# Patient Record
Sex: Male | Born: 1985
Health system: Southern US, Community
[De-identification: ages and names within clinical notes are randomized; demographics above are authoritative.]

## PROBLEM LIST (undated history)

## (undated) DIAGNOSIS — F329 Major depressive disorder, single episode, unspecified: Secondary | ICD-10-CM

## (undated) DIAGNOSIS — J189 Pneumonia, unspecified organism: Secondary | ICD-10-CM

## (undated) DIAGNOSIS — F988 Other specified behavioral and emotional disorders with onset usually occurring in childhood and adolescence: Secondary | ICD-10-CM

## (undated) DIAGNOSIS — F411 Generalized anxiety disorder: Secondary | ICD-10-CM

## (undated) DIAGNOSIS — G4700 Insomnia, unspecified: Secondary | ICD-10-CM

## (undated) DIAGNOSIS — F3289 Other specified depressive episodes: Secondary | ICD-10-CM

## (undated) DIAGNOSIS — J309 Allergic rhinitis, unspecified: Secondary | ICD-10-CM

## (undated) HISTORY — DX: Major depressive disorder, single episode, unspecified: F32.9

## (undated) HISTORY — DX: Allergic rhinitis, unspecified: J30.9

## (undated) HISTORY — DX: Other specified behavioral and emotional disorders with onset usually occurring in childhood and adolescence: F98.8

## (undated) HISTORY — DX: Insomnia, unspecified: G47.00

## (undated) HISTORY — DX: Pneumonia, unspecified organism: J18.9

## (undated) HISTORY — DX: Other specified depressive episodes: F32.89

## (undated) HISTORY — DX: Generalized anxiety disorder: F41.1

---
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
## 2008-10-29 HISTORY — PX: APPENDECTOMY: SHX54

## 2009-04-07 ENCOUNTER — Inpatient Hospital Stay (HOSPITAL_COMMUNITY): Admission: EM | Admit: 2009-04-07 | Discharge: 2009-04-08 | Payer: Self-pay | Admitting: Emergency Medicine

## 2009-04-07 ENCOUNTER — Encounter (INDEPENDENT_AMBULATORY_CARE_PROVIDER_SITE_OTHER): Payer: Self-pay | Admitting: Surgery

## 2010-04-24 ENCOUNTER — Encounter: Admission: RE | Admit: 2010-04-24 | Discharge: 2010-04-24 | Payer: Self-pay | Admitting: Family Medicine

## 2010-04-27 ENCOUNTER — Inpatient Hospital Stay (HOSPITAL_COMMUNITY): Admission: EM | Admit: 2010-04-27 | Discharge: 2010-05-02 | Payer: Self-pay | Admitting: Emergency Medicine

## 2010-04-27 ENCOUNTER — Encounter: Payer: Self-pay | Admitting: Internal Medicine

## 2010-04-27 ENCOUNTER — Ambulatory Visit: Payer: Self-pay | Admitting: Internal Medicine

## 2010-04-27 DIAGNOSIS — J189 Pneumonia, unspecified organism: Secondary | ICD-10-CM | POA: Insufficient documentation

## 2010-04-27 LAB — CONVERTED CEMR LAB
Eosinophils Relative: 0.1 %
Hemoglobin: 13.6 g/dL
Lymphocytes, automated: 14 %
Monocytes Relative: 6 %
Neutrophils Relative %: 79 %
Platelets: 185 10*3/uL
WBC: 8.5 10*3/uL

## 2010-04-29 ENCOUNTER — Encounter: Payer: Self-pay | Admitting: Internal Medicine

## 2010-04-29 LAB — CONVERTED CEMR LAB
CO2: 25 meq/L
MCV: 90.5 fL
Platelets: 212 10*3/uL
Potassium: 3.8 meq/L
Sodium: 136 meq/L

## 2010-05-01 ENCOUNTER — Encounter: Payer: Self-pay | Admitting: Internal Medicine

## 2010-05-01 LAB — CONVERTED CEMR LAB
Eosinophils Relative: 4 %
HCT: 37.2 %
Hemoglobin: 13.1 g/dL
MCV: 90.7 fL
Monocytes Relative: 7 %
Platelets: 348 10*3/uL
RDW: 12.2 %

## 2010-05-23 ENCOUNTER — Encounter: Payer: Self-pay | Admitting: Internal Medicine

## 2010-05-23 DIAGNOSIS — F411 Generalized anxiety disorder: Secondary | ICD-10-CM | POA: Insufficient documentation

## 2010-05-23 DIAGNOSIS — G4700 Insomnia, unspecified: Secondary | ICD-10-CM | POA: Insufficient documentation

## 2010-05-26 ENCOUNTER — Ambulatory Visit: Payer: Self-pay | Admitting: Internal Medicine

## 2010-05-26 DIAGNOSIS — J309 Allergic rhinitis, unspecified: Secondary | ICD-10-CM | POA: Insufficient documentation

## 2010-05-26 DIAGNOSIS — F329 Major depressive disorder, single episode, unspecified: Secondary | ICD-10-CM | POA: Insufficient documentation

## 2010-05-26 DIAGNOSIS — F3289 Other specified depressive episodes: Secondary | ICD-10-CM | POA: Insufficient documentation

## 2010-09-06 ENCOUNTER — Ambulatory Visit: Payer: Self-pay | Admitting: Internal Medicine

## 2010-09-06 ENCOUNTER — Encounter: Payer: Self-pay | Admitting: Internal Medicine

## 2010-09-06 DIAGNOSIS — J0110 Acute frontal sinusitis, unspecified: Secondary | ICD-10-CM | POA: Insufficient documentation

## 2010-09-28 ENCOUNTER — Ambulatory Visit: Payer: Self-pay | Admitting: Internal Medicine

## 2010-09-28 LAB — CONVERTED CEMR LAB
AST: 23 units/L (ref 0–37)
Alkaline Phosphatase: 72 units/L (ref 39–117)
Calcium: 9.2 mg/dL (ref 8.4–10.5)
Creatinine, Ser: 0.8 mg/dL (ref 0.4–1.5)
GFR calc non Af Amer: 135.82 mL/min (ref >60)
Glucose, Bld: 87 mg/dL (ref 70–99)
HCT: 42.7 % (ref 39.0–52.0)
HDL: 37.7 mg/dL — ABNORMAL LOW (ref >39.00)
Hemoglobin: 14.9 g/dL (ref 13.0–17.0)
LDL Cholesterol: 142 mg/dL — ABNORMAL HIGH (ref 0–99)
Lymphocytes Relative: 32.1 % (ref 12.0–46.0)
Lymphs Abs: 1.7 10*3/uL (ref 0.7–4.0)
Monocytes Absolute: 0.4 10*3/uL (ref 0.1–1.0)
Monocytes Relative: 8 % (ref 3.0–12.0)
RBC: 4.74 M/uL (ref 4.22–5.81)
Triglycerides: 93 mg/dL (ref 0.0–149.0)
WBC: 5.2 10*3/uL (ref 4.5–10.5)

## 2010-11-28 NOTE — Letter (Signed)
Summary: [facility]  [address] [place], [place] [address]   Phone: [phone]  Fax: [phone]    Today's Date: September 06, 2010  Name of Patient: Matthew Glover  The above named patient had a medical visit today   Please take this into consideration when reviewing the time away from work.   Special Instructions:  [  ] None  [ xx ] To be off the remainder of today, returning to the normal work schedule tomorrow.  [  ] To be off until the next scheduled appointment on ______________________.  [  ] Other ________________________________________________________________ ________________________________________________________________________   Sincerely yours,   Rene Paci MD

## 2010-11-28 NOTE — Assessment & Plan Note (Signed)
Summary: 3-6 MSO F/U #/CD   Vital Signs:  Patient profile:   25 year old male Height:      71 inches (180.34 cm) Weight:      214.0 pounds (97.27 kg) O2 Sat:      96 % on Room air Temp:     97.0 degrees F (36.11 degrees C) oral Pulse rate:   59 / minute BP sitting:   110 / 70  (left arm) Cuff size:   large  Vitals Entered By: Orlan Leavens RMA (September 28, 2010 9:50 AM)  O2 Flow:  Room air CC: 3-6 month follow-up Is Patient Diabetic? No Pain Assessment Patient in pain? no        Primary Care Provider:  Newt Lukes MD  CC:  3-6 month follow-up.  History of Present Illness: patient is here today for annual physical. Patient feels well and has no complaints.   reviewed chronic issues- 1) hosp for RLL PNA 04/2010 at [facility]  required BiPAP overnight for assoc hypoxic failure completed all abx - breathing returned to normal - no cough, sputum or fever - resolved pleuritic pain lower posterior right side   2) grief reaction hx - precipitated by sudden and unexpected death of father Sep 29, 2009 - no longer using ambein to help with sleep - denies sadness, no tearfulness or anxiety - denies hopeless or difficulty with relationships/work has not been to counseling at any time  3) allergic rhinitis - use otc meds as needed for same and nasal steroids-  no current or recent flares -  no sinus pressure - never on rx med for same resolution of prior sinus symptoms 1 mo ago (see prior OV)  Preventive Screening-Counseling & Management  Alcohol-Tobacco     Alcohol drinks/day: <1     Alcohol Counseling: not indicated; use of alcohol is not excessive or problematic     Smoking Status: never     Tobacco Counseling: not indicated; no tobacco use  Caffeine-Diet-Exercise     Caffeine use/day: 5-6     Caffeine Counseling: decrease use of caffeine     Diet Counseling: not indicated; diet is assessed to be healthy     Does Patient Exercise: no     Exercise Counseling: to improve  exercise regimen     Depression Counseling: further diagnostic testing and/or other treatment is indicated  Safety-Violence-Falls     Seat Belt Counseling: not indicated; patient wears seat belts     Helmet Counseling: not indicated; patient wears helmet when riding bicycle/motocycle     Firearms in the Home: firearms in the home     Firearm Counseling: not indicated; uses recommended firearm safety measures     Smoke Detectors: yes     Violence in the Home: no risk noted     Fall Risk Counseling: not indicated; no significant falls noted  Clinical Review Panels:  CBC   WBC:  6.4 (05/01/2010)   RBC:  4.11 (05/01/2010)   Hgb:  13.1 (05/01/2010)   Hct:  37.2 (05/01/2010)   Platelets:  348 (05/01/2010)   MCV  90.7 (05/01/2010)   RDW  12.2 (05/01/2010)   PMN:  64 (05/01/2010)   Monos:  7 (05/01/2010)   Eosinophils:  4 (05/01/2010)   Basophil:  0 (05/01/2010)  Complete Metabolic Panel   Glucose:  116 (04/29/2010)   Sodium:  136 (04/29/2010)   Potassium:  3.8 (04/29/2010)   Chloride:  104 (04/29/2010)   CO2:  25 (04/29/2010)   Current Medications (  verified): 1)  Ambien 5 Mg Tabs (Zolpidem Tartrate) .... Take 1 At Bedtime As Needed 2)  Nasonex 50 Mcg/act Susp (Mometasone Furoate) .... 2 Sprays Each Nostril  Every Morning  Allergies (verified): 1)  ! Tylenol 2)  ! Ibuprofen  Past History:  Past medical, surgical, family and social histories (including risk factors) reviewed, and no changes noted (except as noted below).  Past Medical History: Anxiety/Depression   grief rxn after sudden death of father 09/26/09 Allergic rhinitis bilateral pneumonia, req bipap 04/2010 hosp  Past Surgical History: Reviewed history from 05/26/2010 and no changes required. Appendectomy (2010)  Family History: Reviewed history from 05/26/2010 and no changes required. Family History Diabetes 1st degree relative (paent, grandparent) Family History High cholesterol (parent,  grandparent) Heart disease (parent, grandparent) Family History Hypertension (parent, grandparent)  Social History: Reviewed history from 05/26/2010 and no changes required. 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         also see HPI above. I have reviewed all other systems and they were negative.   Physical Exam  General:  alert, well-developed, well-nourished, and cooperative to examination.    Head:  Normocephalic and atraumatic without obvious abnormalities. No apparent alopecia or balding. Eyes:  vision grossly intact; pupils equal, round and reactive to light.  conjunctiva and lids normal.    Ears:  normal pinnae bilaterally, without erythema, swelling, or tenderness to palpation. TMs clear, without effusion, or cerumen impaction. Hearing grossly normal bilaterally  Mouth:  teeth and gums in good repair; mucous membranes moist, without lesions or ulcers. oropharynx clear without exudate, no erythema.  Neck:  supple, full ROM, no masses, no thyromegaly; no thyroid nodules or tenderness. no JVD or carotid bruits.   Lungs:  normal respiratory effort, no intercostal retractions or use of accessory muscles; normal breath sounds bilaterally - no crackles and no wheezes.    Heart:  normal rate, regular rhythm, no murmur, and no rub. BLE without edema. Abdomen:  soft, non-tender, normal bowel sounds, no distention; no masses and no appreciable hepatomegaly or splenomegaly.   Genitalia:  defer at pt request Msk:  No deformity or scoliosis noted of thoracic or lumbar spine.   Neurologic:  alert & oriented X3 and cranial nerves II-XII symetrically intact.  strength normal in all extremities, sensation intact to light touch, and gait normal. speech fluent without dysarthria or aphasia; follows  commands with good comprehension.  Skin:  no rashes, vesicles, ulcers, or erythema. No nodules or irregularity to palpation.  Psych:  Oriented X3, memory intact for recent and remote, normally interactive, good eye contact, not anxious appearing, not depressed appearing, and not agitated.      Impression & Recommendations:  Problem # 1:  PREVENTIVE HEALTH CARE (ICD-V70.0) Patient has been counseled on age-appropriate routine health concerns for screening and prevention. These are reviewed and up-to-date. Immunizations are up-to-date or declined. Labs ordered and will be reviewed.  Orders: TLB-Lipid Panel (80061-LIPID) TLB-BMP (Basic Metabolic Panel-BMET) (80048-METABOL) TLB-CBC Platelet - w/Differential (85025-CBCD) TLB-Hepatic/Liver Function Pnl (80076-HEPATIC) TLB-TSH (Thyroid Stimulating Hormone) (84443-TSH)  Problem # 2:  ALLERGIC RHINITIS (ICD-477.9)  His updated medication list for this problem includes:    Nasonex 50 Mcg/act Susp (Mometasone furoate) .Marland Kitchen... 2 sprays each nostril  every morning  Discussed use of allergy medications and environmental measures.  Problem # 3:  INSOMNIA (ICD-780.52) resolved The following medications were removed from the medication list:    Ambien 5 Mg Tabs (Zolpidem tartrate) .Marland Kitchen... Take 1 at bedtime as needed  Complete Medication List: 1)  Nasonex 50 Mcg/act Susp (Mometasone furoate) .... 2 sprays each nostril  every morning  Patient Instructions: 1)  it was good to see you today. 2)  exam and vitals look good 3)  no medication changes  4)  test(s) ordered today - your results will be posted on the phone tree for review in 48-72 hours from the time of test completion; call [phone] and enter your 9 digit MRN (listed above on this page, just below your name); if any changes need to be made or there are abnormal results, you will be contacted directly.  5)  Please schedule a follow-up appointment as needed.   Orders Added: 1)  TLB-Lipid  Panel [80061-LIPID] 2)  TLB-BMP (Basic Metabolic Panel-BMET) [80048-METABOL] 3)  TLB-CBC Platelet - w/Differential [85025-CBCD] 4)  TLB-Hepatic/Liver Function Pnl [80076-HEPATIC] 5)  TLB-TSH (Thyroid Stimulating Hormone) [84443-TSH] 6)  Est. Patient 18-39 years [91478]

## 2010-11-28 NOTE — Assessment & Plan Note (Signed)
Summary: Matthew Glover-...   Vital Signs:  Patient profile:   25 year old male Height:      71 inches (180.34 cm) Weight:      215.4 pounds (97.91 kg) BMI:     30.15 O2 Sat:      98 % on Room air Temp:     98.0 degrees F (36.67 degrees C) oral Pulse rate:   62 / minute BP sitting:   120 / 72  (left arm) Cuff size:   large  Vitals Entered By: Orlan Leavens RMA (May 26, 2010 10:39 AM)  O2 Flow:  Room air CC: New patient Is Patient Diabetic? No Pain Assessment Patient in pain? no        Primary Care Provider:  Newt Lukes MD  CC:  New patient.  History of Present Illness: new pt to me and our practice - here to est care -  1) recent hosp for RLL PNA 6/30-7/5, 2011 at [facility]  required BiPAP overnight for assoc hypoxic failure completed all abx - breathing returned to normal - no cough, sputum or fever - occ pleuritic pain lower posterior right side with deep insp effort  2) grief reaction - precipitated by sudden and unexpected death of father 2009-09-22 - using ambein to help with sleep issues since that time - feels sadness but less than initially - no tearfulness or anxiety - denies hopeless or difficulty with relationships/work has not been to counseling at any time  3) allergic rhinitis - use otc meds as needed for same - no current flares -  no sinus pressure - never on rx med for same  Preventive Screening-Counseling & Management  Alcohol-Tobacco     Alcohol drinks/day: <1     Alcohol Counseling: not indicated; use of alcohol is not excessive or problematic     Smoking Status: never     Tobacco Counseling: not indicated; no tobacco use  Caffeine-Diet-Exercise     Caffeine use/day: 5-6     Caffeine Counseling: decrease use of caffeine     Diet Counseling: not indicated; diet is assessed to be healthy     Does Patient Exercise: no     Exercise Counseling: to improve exercise regimen     Depression  Counseling: further diagnostic testing and/or other treatment is indicated  Safety-Violence-Falls     Seat Belt Counseling: not indicated; patient wears seat belts     Helmet Counseling: not indicated; patient wears helmet when riding bicycle/motocycle     Firearms in the Home: firearms in the home     Firearm Counseling: not indicated; uses recommended firearm safety measures     Smoke Detectors: yes     Violence in the Home: no risk noted     Fall Risk Counseling: not indicated; no significant falls noted  Clinical Review Panels:  CBC   WBC:  6.4 (05/01/2010)   RBC:  4.11 (05/01/2010)   Hgb:  13.1 (05/01/2010)   Hct:  37.2 (05/01/2010)   Platelets:  348 (05/01/2010)   MCV  90.7 (05/01/2010)   RDW  12.2 (05/01/2010)   PMN:  64 (05/01/2010)   Monos:  7 (05/01/2010)   Eosinophils:  4 (05/01/2010)   Basophil:  0 (05/01/2010)  Complete Metabolic Panel   Glucose:  116 (04/29/2010)   Sodium:  136 (04/29/2010)   Potassium:  3.8 (04/29/2010)   Chloride:  104 (04/29/2010)   CO2:  25 (04/29/2010)   Current Medications (verified): 1)  Ambien  5 Mg Tabs (Zolpidem Tartrate) .... Take 1 At Bedtime As Needed  Allergies (verified): 1)  ! Tylenol 2)  ! 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 I have reviewed all other systems and they were  negative.   Physical Exam  General:  alert, well-developed, well-nourished, and cooperative to examination.    Eyes:  vision grossly intact; pupils equal, round and reactive to light.  conjunctiva and lids normal.    Neck:  thick, supple, full ROM, no masses, no thyromegaly; no thyroid nodules or tenderness. no JVD or carotid bruits.   Lungs:  normal respiratory effort, no intercostal retractions or use of accessory muscles; normal breath sounds bilaterally - no crackles and no wheezes.    Heart:  normal rate, regular rhythm, no murmur, and no rub. BLE without edema. normal DP pulses and normal cap refill in all 4 extremities    Skin:  no rashes, vesicles, ulcers, or erythema. No nodules or irregularity to palpation.  Psych:  Oriented X3, memory intact for recent and remote, normally interactive, good eye contact, not anxious appearing, not depressed appearing, and not agitated.      Impression & Recommendations:  Problem # 1:  PNEUMONIA (ICD-486)  hosp 04/2010 at [facility] for same with hypoxic resp failure, required BiPAP - s/p abx - fully recovered - no pulm symptoms or hypoxia - hosp course, labs and tests reviewed -  Problem # 2:  ANXIETY (ICD-300.00) a/w greief reaction from unexpected loss of father 17-Sep-2009 - MI seems to be recovering approp - counseling offered and support ok to cont ambein as needed  The following medications were removed from the medication list:    Xanax 0.25 Mg Tabs (Alprazolam) .Marland Kitchen... Take 1 q 8 hours as needed  Problem # 3:  ALLERGIC RHINITIS (ICD-477.9)  Discussed use of allergy medications and environmental measures.   Complete Medication List: 1)  Ambien 5 Mg Tabs (Zolpidem tartrate) .... Take 1 at bedtime as needed  Patient Instructions: 1)  it was good to see you today. 2)  hospital course reviewed - 3)  Please schedule a follow-up appointment in 3-6 months to check cholesterol and review mood, call sooner if problems.

## 2010-11-28 NOTE — Assessment & Plan Note (Signed)
Summary: sore throat/headache/cd   Vital Signs:  Patient profile:   25 year old male Height:      71 inches (180.34 cm) Weight:      215 pounds (97.73 kg) O2 Sat:      98 % on Room air Temp:     97.5 degrees F (36.39 degrees C) oral Pulse rate:   61 / minute BP sitting:   112 / 68  (left arm) Cuff size:   large  Vitals Entered By: Orlan Leavens RMA (September 06, 2010 2:10 PM)  O2 Flow:  Room air CC: Headache & sore throat x's1 week, URI symptoms Is Patient Diabetic? No Pain Assessment Patient in pain? no        Primary Care Provider:  Newt Lukes MD  CC:  Headache & sore throat x's1 week and URI symptoms.  History of Present Illness: prior OV 04/2010 reviewed  1) recent hosp for RLL PNA 6/30-7/5, 2011 at [facility]  required BiPAP overnight for assoc hypoxic failure completed all abx - breathing returned to normal - no cough, sputum or fever - occ pleuritic pain lower posterior right side with deep insp effort  2) grief reaction - precipitated by sudden and unexpected death of father September 27, 2009 - using ambein to help with sleep issues since that time - feels sadness but less than initially - no tearfulness or anxiety - denies hopeless or difficulty with relationships/work has not been to counseling at any time  3) allergic rhinitis - use otc meds as needed for same - no current flares -  no sinus pressure - never on rx med for same  URI Symptoms      This is a 25 year old man who presents with URI symptoms.  The symptoms began 6 days ago.  The severity is described as moderate.  headache above both eyes, forehead. similar now to symptoms at start of pna infx summer 2011.  The patient reports nasal congestion, purulent nasal discharge, sore throat, and earache, but denies dry cough, productive cough, and sick contacts.  Associated symptoms include fever.  The patient denies dyspnea, wheezing, vomiting, and diarrhea.  The patient also reports headache, muscle aches, and  severe fatigue.  The patient denies sneezing, seasonal symptoms, and response to antihistamine.  Risk factors for Strep sinusitis include absence of cough.  The patient denies the following risk factors for Strep sinusitis: tooth pain and tender adenopathy.    Clinical Review Panels:  CBC   WBC:  6.4 (05/01/2010)   RBC:  4.11 (05/01/2010)   Hgb:  13.1 (05/01/2010)   Hct:  37.2 (05/01/2010)   Platelets:  348 (05/01/2010)   MCV  90.7 (05/01/2010)   RDW  12.2 (05/01/2010)   PMN:  64 (05/01/2010)   Monos:  7 (05/01/2010)   Eosinophils:  4 (05/01/2010)   Basophil:  0 (05/01/2010)  Complete Metabolic Panel   Glucose:  116 (04/29/2010)   Sodium:  136 (04/29/2010)   Potassium:  3.8 (04/29/2010)   Chloride:  104 (04/29/2010)   CO2:  25 (04/29/2010)   Current Medications (verified): 1)  Ambien 5 Mg Tabs (Zolpidem Tartrate) .... Take 1 At Bedtime As Needed  Allergies (verified): 1)  ! Tylenol 2)  ! Ibuprofen  Past History:  Past Medical History: Anxiety/Depression, after sudden death of father 09-27-2009 Allergic rhinitis bilateral pneumonia, req bipap 04/2010 [facility]  Review of Systems  The patient denies vision loss, decreased hearing, hoarseness, chest pain, dyspnea on exertion, and severe indigestion/heartburn.  Physical Exam  General:  alert, well-developed, well-nourished, and cooperative to examination.   mom at side Head:  mod pain to palp frontal sinuses Eyes:  vision grossly intact; pupils equal, round and reactive to light.  conjunctiva and lids normal.    Ears:  l tm mild red, r tm clear Mouth:  teeth and gums in good repair; mucous membranes moist, without lesions or ulcers. oropharynx clear without exudate, mild erythema.  pnd Lungs:  normal respiratory effort, no intercostal retractions or use of accessory muscles; normal breath sounds bilaterally - no crackles and no wheezes.    Heart:  normal rate, regular rhythm, no murmur, and no rub. BLE without  edema.   Impression & Recommendations:  Problem # 1:  ACUTE FRONTAL SINUSITIS (ICD-461.1)  His updated medication list for this problem includes:    Augmentin 875-125 Mg Tabs (Amoxicillin-pot clavulanate) .Marland Kitchen... 1 by mouth two times a day x 7 days    Nasonex 50 Mcg/act Susp (Mometasone furoate) .Marland Kitchen... 2 sprays each nostril  every morning  Orders: Prescription Created Electronically [phone])  Instructed on treatment. Call if symptoms persist or worsen.   Problem # 2:  PNEUMONIA (ICD-486)  His updated medication list for this problem includes:    Augmentin 875-125 Mg Tabs (Amoxicillin-pot clavulanate) .Marland Kitchen... 1 by mouth two times a day x 7 days  Orders: T-2 View CXR (71020TC)  hosp 04/2010 at [facility] for same with hypoxic resp failure, required BiPAP - s/p abx - fully recovered - no pulm symptoms or hypoxia - hosp course, labs and tests reviewed - reck xray f/u noe to ensure clearing  nstructed patient to complete antibiotics, and call for worsened shortness of breath or new symptoms.   Complete Medication List: 1)  Ambien 5 Mg Tabs (Zolpidem tartrate) .... Take 1 at bedtime as needed 2)  Augmentin 875-125 Mg Tabs (Amoxicillin-pot clavulanate) .Marland Kitchen.. 1 by mouth two times a day x 7 days 3)  Nasonex 50 Mcg/act Susp (Mometasone furoate) .... 2 sprays each nostril  every morning  Patient Instructions: 1)  it was good to see you today. 2)  Augmentin and Nasonex for sinus symptoms as discussed - your prescriptions have been electronically submitted to your pharmacy. Please take as directed. Contact our office if you believe you're having problems with the medication(s).  3)  chest xray ordered today - your results will be called to you after review 4)  Get plenty of rest, drink lots of clear liquids, and use Tylenol or Ibuprofen for fever and comfort. Return in 7-10 days if you're not better:sooner if you're feeling worse. 5)  work note provided for today Prescriptions: NASONEX 50 MCG/ACT SUSP  (MOMETASONE FUROATE) 2 sprays each nostril  every morning  #1 x 3   Entered and Authorized by:   Newt Lukes MD   Signed by:   Newt Lukes MD on 09/06/2010   Method used:   Electronically to        [facility] [address] [id number]* (retail)       [address] [place], [place]  [address]       Ph: [phone]       Fax: [phone]   RxID:   [id number] AUGMENTIN 875-125 MG TABS (AMOXICILLIN-POT CLAVULANATE) 1 by mouth two times a day x 7 days  #14 x 0   Entered and Authorized by:   Newt Lukes MD   Signed by:   Newt Lukes MD  on 09/06/2010   Method used:   Electronically to        [id number]* (retail)       [address] [place], [place]  [address]       Ph: [phone]       Fax: [phone]   RxID:   [id number]    Orders Added: 1)  Est. Patient Level IV [44010] 2)  Prescription Created Electronically [[id number]] 3)  T-2 View CXR [71020TC]

## 2011-01-14 LAB — COMPREHENSIVE METABOLIC PANEL
ALT: 20 U/L (ref 0–53)
ALT: 26 U/L (ref 0–53)
AST: 25 U/L (ref 0–37)
Albumin: 2.6 g/dL — ABNORMAL LOW (ref 3.5–5.2)
Alkaline Phosphatase: 41 U/L (ref 39–117)
Alkaline Phosphatase: 43 U/L (ref 39–117)
CO2: 27 mEq/L (ref 19–32)
Calcium: 8.1 mg/dL — ABNORMAL LOW (ref 8.4–10.5)
Calcium: 8.1 mg/dL — ABNORMAL LOW (ref 8.4–10.5)
Chloride: 104 mEq/L (ref 96–112)
Chloride: 105 mEq/L (ref 96–112)
Creatinine, Ser: 0.69 mg/dL (ref 0.4–1.5)
GFR calc Af Amer: >60 mL/min (ref >60)
GFR calc non Af Amer: >60 mL/min (ref >60)
Glucose, Bld: 116 mg/dL — ABNORMAL HIGH (ref 70–99)
Potassium: 4.2 mEq/L (ref 3.5–5.1)
Sodium: 136 mEq/L (ref 135–145)
Sodium: 137 mEq/L (ref 135–145)
Total Bilirubin: 0.9 mg/dL (ref 0.3–1.2)
Total Protein: 5.8 g/dL — ABNORMAL LOW (ref 6.0–8.3)

## 2011-01-14 LAB — BLOOD GAS, ARTERIAL
Acid-Base Excess: 1 mmol/L (ref 0.0–2.0)
Acid-Base Excess: 0.5 mmol/L (ref 0.0–2.0)
Bicarbonate: 24.7 mEq/L — ABNORMAL HIGH (ref 20.0–24.0)
Delivery systems: POSITIVE
Drawn by: 213381
Drawn by: 213381
Drawn by: 328211
FIO2: 1 %
FIO2: 1 %
Inspiratory PAP: 11
O2 Content: 10 L/min
O2 Saturation: 98.5 %
Patient temperature: 98.6
Pressure support: 3 cmH2O
RATE: 10 resp/min
TCO2: 21.6 mmol/L (ref 0–100)
TCO2: 22 mmol/L (ref 0–100)
TCO2: 21 mmol/L (ref 0–100)
pCO2 arterial: 36.4 mmHg (ref 35.0–45.0)
pCO2 arterial: 33.1 mmHg — ABNORMAL LOW (ref 35.0–45.0)
pH, Arterial: 7.441 (ref 7.350–7.450)
pO2, Arterial: 130 mmHg — ABNORMAL HIGH (ref 80.0–100.0)

## 2011-01-14 LAB — CBC
MCH: 31 pg (ref 26.0–34.0)
MCH: 31.2 pg (ref 26.0–34.0)
MCHC: 34.5 g/dL (ref 30.0–36.0)
MCHC: 35.1 g/dL (ref 30.0–36.0)
MCV: 90.5 fL (ref 78.0–100.0)
MCV: 88.7 fL (ref 78.0–100.0)
Platelets: 348 10*3/uL (ref 150–400)
Platelets: 185 10*3/uL (ref 150–400)
RBC: 3.7 MIL/uL — ABNORMAL LOW (ref 4.22–5.81)
RBC: 3.9 MIL/uL — ABNORMAL LOW (ref 4.22–5.81)
RBC: 4.11 MIL/uL — ABNORMAL LOW (ref 4.22–5.81)
RDW: 11.8 % (ref 11.5–15.5)
WBC: 6.4 10*3/uL (ref 4.0–10.5)
WBC: 7.6 10*3/uL (ref 4.0–10.5)

## 2011-01-14 LAB — MRSA PCR SCREENING: MRSA by PCR: POSITIVE — AB

## 2011-01-14 LAB — BASIC METABOLIC PANEL
BUN: 6 mg/dL (ref 6–23)
Calcium: 8.4 mg/dL (ref 8.4–10.5)
Creatinine, Ser: 0.88 mg/dL (ref 0.4–1.5)
GFR calc non Af Amer: >60 mL/min (ref >60)
Potassium: 4 mEq/L (ref 3.5–5.1)
Sodium: 134 mEq/L — ABNORMAL LOW (ref 135–145)

## 2011-01-14 LAB — DIFFERENTIAL
Basophils Absolute: 0 10*3/uL (ref 0.0–0.1)
Basophils Relative: 0 % (ref 0–1)
Eosinophils Absolute: 0.2 10*3/uL (ref 0.0–0.7)
Eosinophils Absolute: 0.1 10*3/uL (ref 0.0–0.7)
Eosinophils Relative: 3 % (ref 0–5)
Eosinophils Relative: 1 % (ref 0–5)
Lymphocytes Relative: 16 % (ref 12–46)
Lymphocytes Relative: 25 % (ref 12–46)
Lymphs Abs: 0.9 10*3/uL (ref 0.7–4.0)
Monocytes Absolute: 0.4 10*3/uL (ref 0.1–1.0)
Monocytes Relative: 7 % (ref 3–12)
Monocytes Relative: 6 % (ref 3–12)
Neutrophils Relative %: 64 % (ref 43–77)
Neutrophils Relative %: 79 % — ABNORMAL HIGH (ref 43–77)

## 2011-01-14 LAB — LEGIONELLA ANTIGEN, URINE: Legionella Antigen, Urine: NEGATIVE

## 2011-01-14 LAB — CULTURE, BLOOD (ROUTINE X 2)

## 2011-01-14 LAB — CULTURE, RESPIRATORY W GRAM STAIN: Culture: NORMAL

## 2011-01-14 LAB — STREP PNEUMONIAE URINARY ANTIGEN: Strep Pneumo Urinary Antigen: NEGATIVE

## 2011-01-14 LAB — PROCALCITONIN: Procalcitonin: <0.5 ng/mL

## 2011-01-14 LAB — HIV 1/2 CONFIRMATION: HIV-2 Ab: NEGATIVE

## 2011-01-21 ENCOUNTER — Emergency Department (HOSPITAL_COMMUNITY)
Admission: EM | Admit: 2011-01-21 | Discharge: 2011-01-21 | Disposition: A | Discharge status: Home / Self Care | Payer: 59 | Attending: Emergency Medicine | Admitting: Emergency Medicine

## 2011-01-21 DIAGNOSIS — T63481A Toxic effect of venom of other arthropod, accidental (unintentional), initial encounter: Secondary | ICD-10-CM | POA: Insufficient documentation

## 2011-01-21 DIAGNOSIS — L989 Disorder of the skin and subcutaneous tissue, unspecified: Secondary | ICD-10-CM | POA: Insufficient documentation

## 2011-01-21 DIAGNOSIS — T6391XA Toxic effect of contact with unspecified venomous animal, accidental (unintentional), initial encounter: Secondary | ICD-10-CM | POA: Insufficient documentation

## 2011-01-22 ENCOUNTER — Encounter: Payer: Self-pay | Admitting: Internal Medicine

## 2011-01-22 ENCOUNTER — Ambulatory Visit (INDEPENDENT_AMBULATORY_CARE_PROVIDER_SITE_OTHER): Payer: 59 | Admitting: Internal Medicine

## 2011-01-22 VITALS — BP 118/60 | HR 62 | Temp 98.4°F | Ht 71.0 in | Wt 205.8 lb

## 2011-01-22 DIAGNOSIS — L0100 Impetigo, unspecified: Secondary | ICD-10-CM | POA: Insufficient documentation

## 2011-01-22 MED ORDER — CHLORHEXIDINE GLUCONATE 2 % EX LIQD: 1.0000 "application " | Freq: Three times a day (TID) | CUTANEOUS | Status: DC

## 2011-01-22 MED ORDER — SULFAMETHOXAZOLE-TRIMETHOPRIM 800-160 MG PO TABS: 1.0000 | ORAL_TABLET | Freq: Two times a day (BID) | ORAL | Status: AC

## 2011-01-22 MED ORDER — MUPIROCIN CALCIUM 2 % EX CREA: TOPICAL_CREAM | Freq: Three times a day (TID) | CUTANEOUS | Status: AC

## 2011-01-22 MED ORDER — CEFTRIAXONE SODIUM 1 G IJ SOLR
1.0000 g | INTRAMUSCULAR | Status: DC
  Administered 2011-01-22: 1 g via INTRAMUSCULAR

## 2011-01-22 MED ORDER — CEFTRIAXONE SODIUM 1 G IJ SOLR: 500.0000 mg | INTRAMUSCULAR | Status: DC

## 2011-01-22 NOTE — Progress Notes (Signed)
  Subjective:    Patient ID: Matthew Glover, male    DOB: 04/09/1986, 25 y.o.   MRN: [id number]  HPI Here for ER follow up - seen <24h ago for bite on buttock +outdoor exposure 72h prior to skin infection onset. Prescribed doxycycline - medication causing nausea so difficult to take. Progressive pain and drainage from infected wounds - widening area affected on left buttock since starting antibiotics. No fever Past Medical History  Diagnosis Date  . ANXIETY 05/23/2010  . ALLERGIC RHINITIS 05/26/2010  . PNEUMONIA 04/27/2010    bilateral, req bipap 04/2010 hosp  . INSOMNIA 05/23/2010  . DEPRESSION 05/26/2010    grief rxn after sudden dealth of father 08/2009     Review of Systems  Constitutional: Negative for fatigue.  Respiratory: Negative for cough.   Cardiovascular: Negative for chest pain.  Neurological: Negative for headaches.       Objective:   Physical Exam BP 118/60  Pulse 62  Temp(Src) 98.4 F (36.9 C) (Oral)  Ht 5\' 11"  (1.803 m)  Wt 205 lb 12.8 oz (93.35 kg)  BMI 28.70 kg/m2  Physical Exam  Constitutional:  oriented to person, place, and time. appears well-developed and well-nourished. No distress, nontoxic.  Mom at side Cardiovascular: Normal rate, regular rhythm and normal heart sounds.  No murmur heard. Pulmonary/Chest: Effort normal and breath sounds normal. No respiratory distress. no wheezes.  Skin: Left buttock with impetigo changes in 2 lesions. Superficial tiny blistering Psychiatric: he has a normal mood and affect. behavior is normal. Judgment and thought content normal.      Assessment & Plan:  See problem list. Medications and labs reviewed today.

## 2011-01-22 NOTE — Patient Instructions (Signed)
It was good to see you today. Stop doxycyline and start generic Septra for your skin infection - also wash with chlorhexidine wash 2-3x/day, then cover with antibiotic cream (mupirocin). Shot rocephin antibiotics given today Your prescription(s) have been submitted to your pharmacy. Please take as directed and contact our office if you believe you are having problem(s) with the medication(s). if your symptoms continue to worsen (pain, fever, etc), or if you are unable take anything by mouth (pills, fluids, etc), you should go to the emergency room or call here for further evaluation and treatment.

## 2011-01-22 NOTE — Assessment & Plan Note (Addendum)
Possible MRSA etiology given colonization of same (prior hospitalization with +nares pcr) Intolerant of doxycycline - change to Septra - erx done Also shot rocephin today and mupirocin cream - wash bid-tid with chlorhex and cover with antibiotic + gauze

## 2011-02-05 LAB — CBC
HCT: 40.1 % (ref 39.0–52.0)
MCV: 90.8 fL (ref 78.0–100.0)
MCV: 91.4 fL (ref 78.0–100.0)
Platelets: 181 10*3/uL (ref 150–400)
Platelets: 190 10*3/uL (ref 150–400)
Platelets: 196 10*3/uL (ref 150–400)
RBC: 4.61 MIL/uL (ref 4.22–5.81)
RDW: 12.1 % (ref 11.5–15.5)
RDW: 12.3 % (ref 11.5–15.5)
WBC: 12.4 10*3/uL — ABNORMAL HIGH (ref 4.0–10.5)
WBC: 7.3 10*3/uL (ref 4.0–10.5)

## 2011-02-05 LAB — URINALYSIS, ROUTINE W REFLEX MICROSCOPIC
Nitrite: NEGATIVE
Protein, ur: NEGATIVE mg/dL
Specific Gravity, Urine: 1.012 (ref 1.005–1.030)
Urobilinogen, UA: 0.2 mg/dL (ref 0.0–1.0)

## 2011-02-05 LAB — COMPREHENSIVE METABOLIC PANEL
ALT: 19 U/L (ref 0–53)
AST: 21 U/L (ref 0–37)
Albumin: 4.1 g/dL (ref 3.5–5.2)
CO2: 28 mEq/L (ref 19–32)
Chloride: 103 mEq/L (ref 96–112)
Creatinine, Ser: 0.74 mg/dL (ref 0.4–1.5)
GFR calc Af Amer: >60 mL/min (ref >60)
Potassium: 3.7 mEq/L (ref 3.5–5.1)
Sodium: 138 mEq/L (ref 135–145)
Total Bilirubin: 1.6 mg/dL — ABNORMAL HIGH (ref 0.3–1.2)

## 2011-02-05 LAB — DIFFERENTIAL
Basophils Absolute: 0.2 10*3/uL — ABNORMAL HIGH (ref 0.0–0.1)
Eosinophils Absolute: 0.1 10*3/uL (ref 0.0–0.7)
Eosinophils Relative: 1 % (ref 0–5)
Lymphocytes Relative: 14 % (ref 12–46)
Monocytes Absolute: 0.7 10*3/uL (ref 0.1–1.0)

## 2011-03-12 ENCOUNTER — Telehealth: Payer: Self-pay

## 2011-03-12 MED ORDER — TRIAMCINOLONE ACETONIDE 0.5 % EX OINT: TOPICAL_OINTMENT | Freq: Two times a day (BID) | CUTANEOUS | Status: DC

## 2011-03-12 NOTE — Telephone Encounter (Signed)
Ok - er done - call for OV if symptoms worse or unimproved - thanks

## 2011-03-12 NOTE — Telephone Encounter (Signed)
Addended by: Orlan Leavens on: 03/12/2011 02:01 PM   Modules accepted: Orders

## 2011-03-12 NOTE — Telephone Encounter (Signed)
Pt's mother informed of Rx and pharmacy

## 2011-03-12 NOTE — Telephone Encounter (Signed)
Pt's mother called stating pt has poison oak rash on his arm. Pt is requesting Rx for steroid cream, please advise.

## 2011-03-13 NOTE — Op Note (Signed)
NAMEDEAGEN, KRASS NO.:  [id number]   MEDICAL RECORD NO.:  [id number]          PATIENT TYPE:  INP   LOCATION:  0098                         FACILITY:  [facility]   PHYSICIAN:  Thornton Park. Daphine Deutscher, MD  DATE OF BIRTH:  Dec 08, 1985   DATE OF PROCEDURE:  04/07/2009  DATE OF DISCHARGE:                               OPERATIVE REPORT   PREOPERATIVE DIAGNOSIS:  Acute appendicitis.   POSTOPERATIVE DIAGNOSIS:  Acute appendicitis.   PROCEDURE:  Laparoscopic appendectomy.   SURGEON:  Dr. Daphine Deutscher.   DESCRIPTION OF PROCEDURE:  This 25 year old white male came in with  acute appendicitis rendered diagnostically by CT scan.  He was seen, and  informed consent was obtained regarding laparoscopic appendectomy at  approximately 0100 hours on June 10.  He was taken to the operating room  where he had laparoscopic appendectomy.  This was done first by prepping  him with Betadine.  Draping him sterilely.  Abdomen was entered through  the umbilicus using Hassan technique without difficulty, insufflating.  An 11 was placed in the left lower quadrant and 5 in the right upper  quadrant, and the appendix was easily visualized, plump, and distended  consistent with acute appendicitis.  The tip had a little bit of  purulence about it.  I isolated the base and transected with the GIA.  I  then went through the mesentery of the appendix with Harmonic scalpel,  dividing it.  The appendix was then placed a bag and brought out through  the umbilicus.  The appendiceal stump was not seen to be bleeding.  Everything looked to be in order.  I sucked out the irrigation fluid  that I used in the pelvis.  I repaired the umbilical defect with simple  0 Vicryl and then looked to closure laparoscopically, and all was in  order.  The wounds were injected with 0.50% Marcaine and were closed  with a 4-0 Vicryl subcutaneously with Benzoin and Steri-Strips.  The  patient tolerated the procedure well and was taken  to recovery room in  satisfactory condition.      Thornton Park Daphine Deutscher, MD  Electronically Signed     MBM/MEDQ  D:  04/07/2009  T:  04/07/2009  Job:  [id number]

## 2011-04-28 IMAGING — CR DG CHEST 1V PORT
1 series · 1 of 1 positions shown · non-contrast
Comparison: 04/28/2010

CLINICAL DATA: Cough, congestion

PORTABLE CHEST - 1 VIEW

[series [date]]
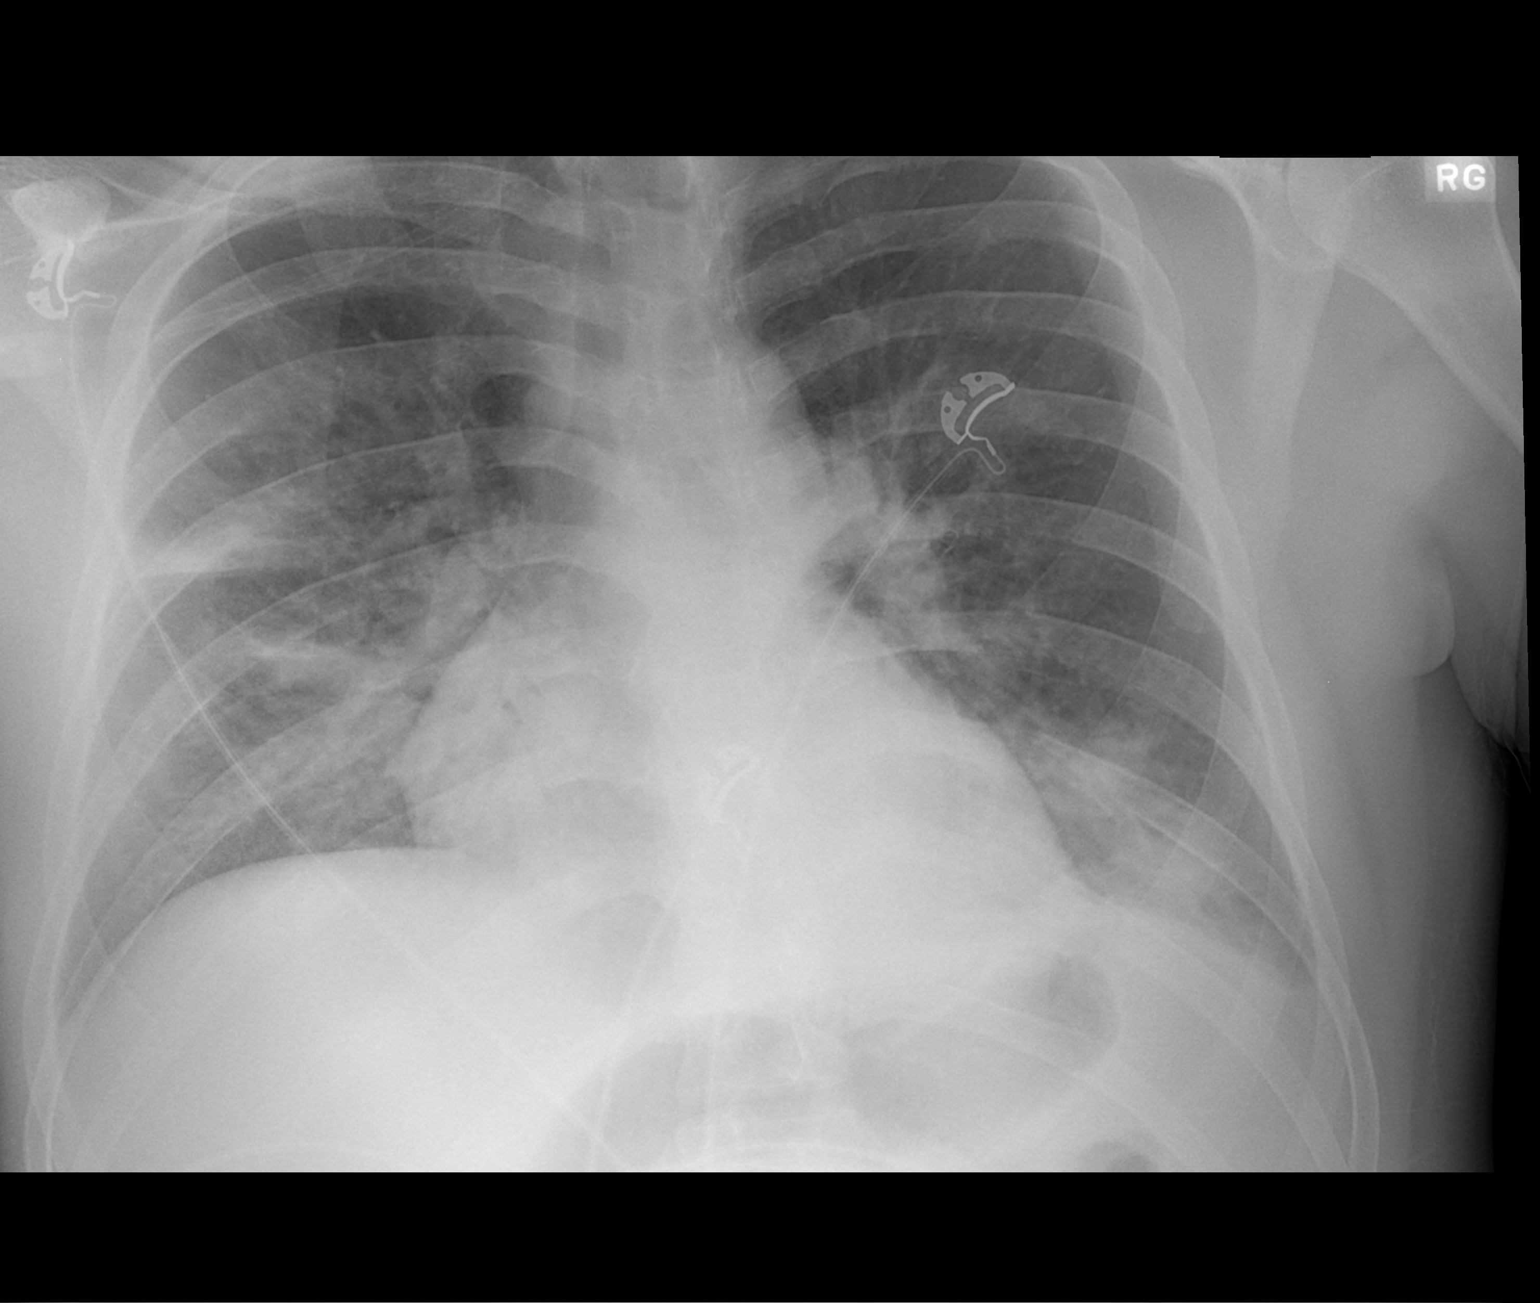

[1 of 1 positions shown; findings below may reference images not displayed]

FINDINGS: Persistent right upper lobe and bilateral lower lobe
airspace opacities are noted with overall mildly improved aeration.
No new finding.  Trace pleural effusions are present.  Heart size
upper limits of normal.
IMPRESSION: Stable multilobar airspace disease, likely pneumonia, with mild
improvement in aeration.  Radiographic resolution of pneumonia [REDACTED]weeks.

## 2011-04-30 IMAGING — CR DG CHEST 2V
2 series · 2 of 2 positions shown · non-contrast
Comparison: 04/29/2010

CLINICAL DATA: Pneumonia

CHEST - 2 VIEW

[w chest pa]
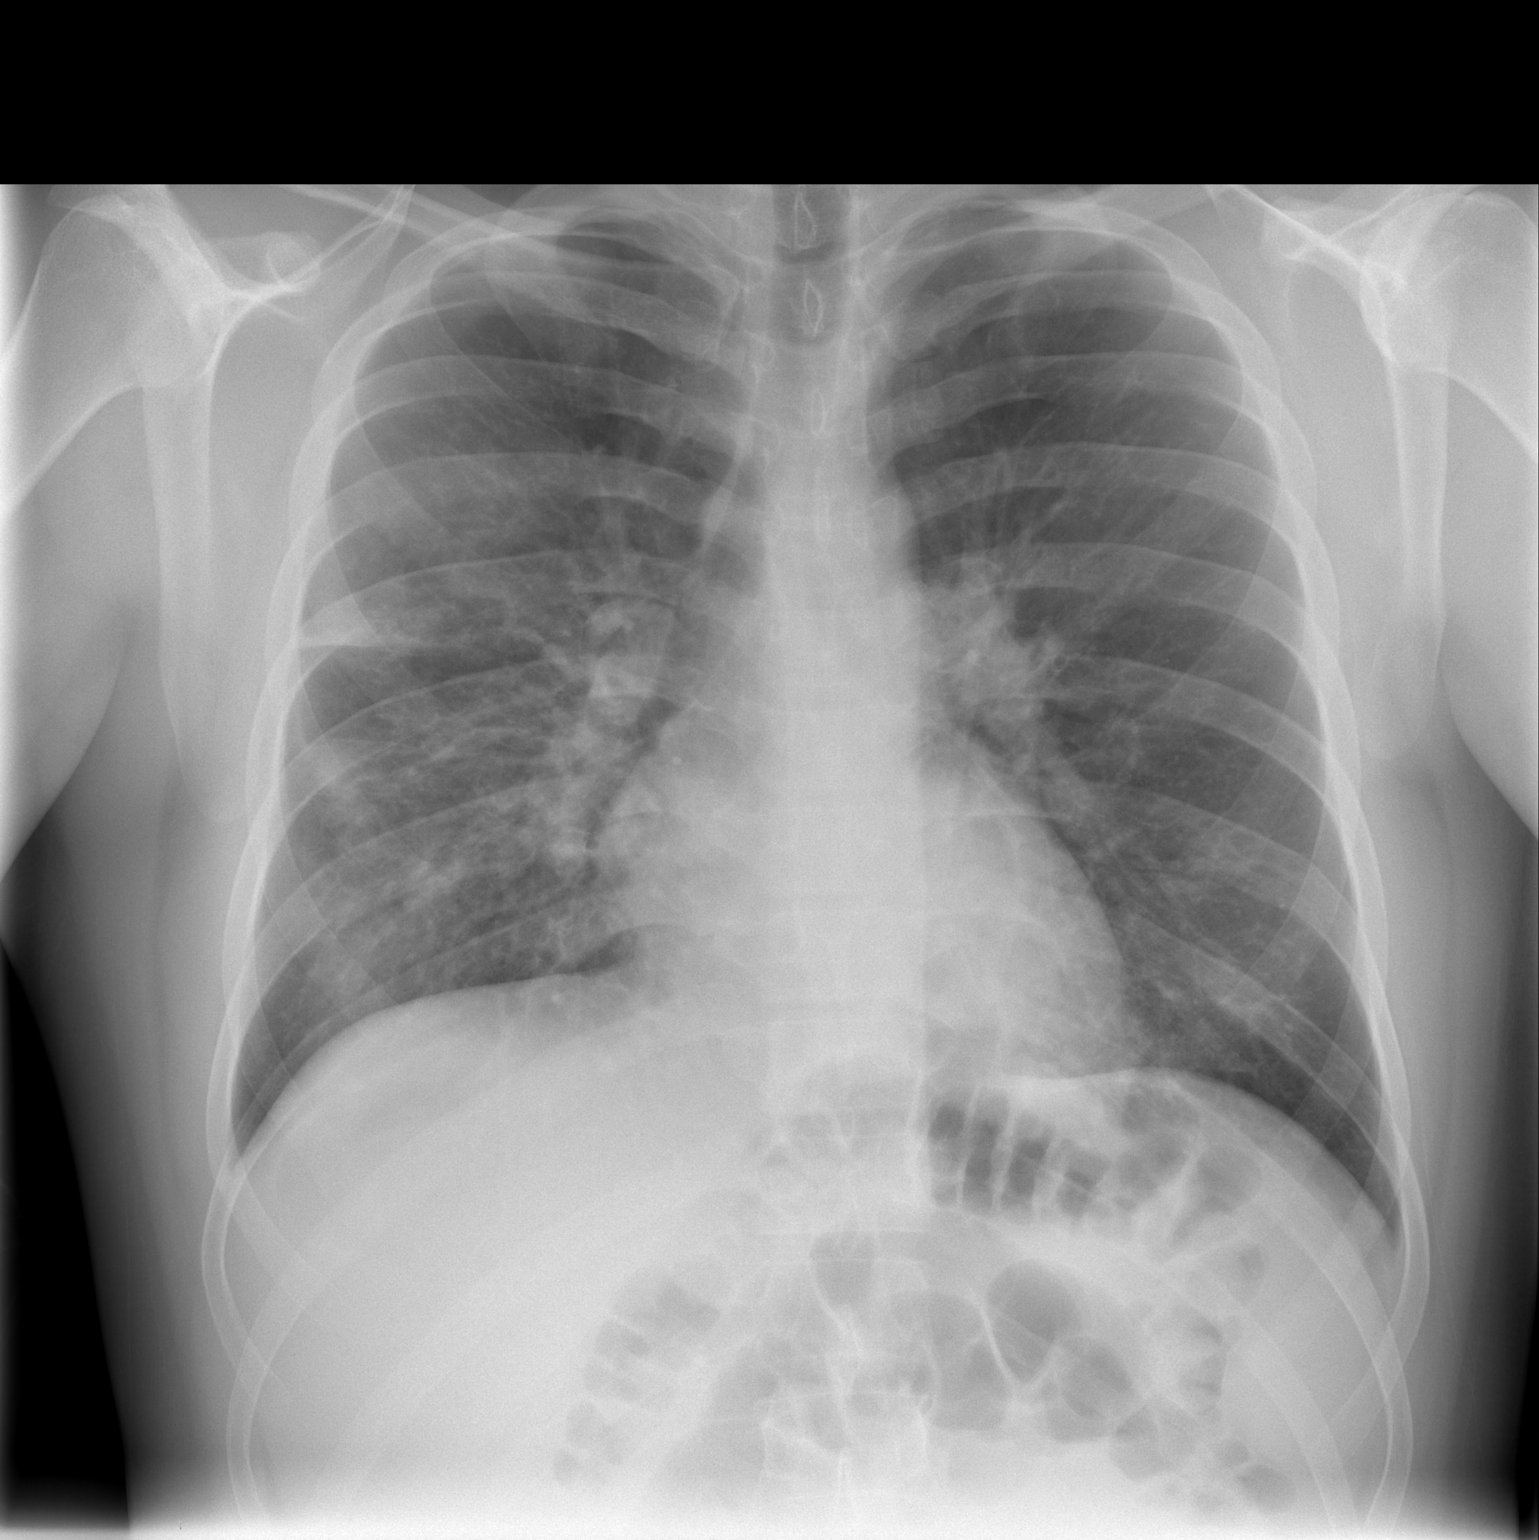

[w chest lat]
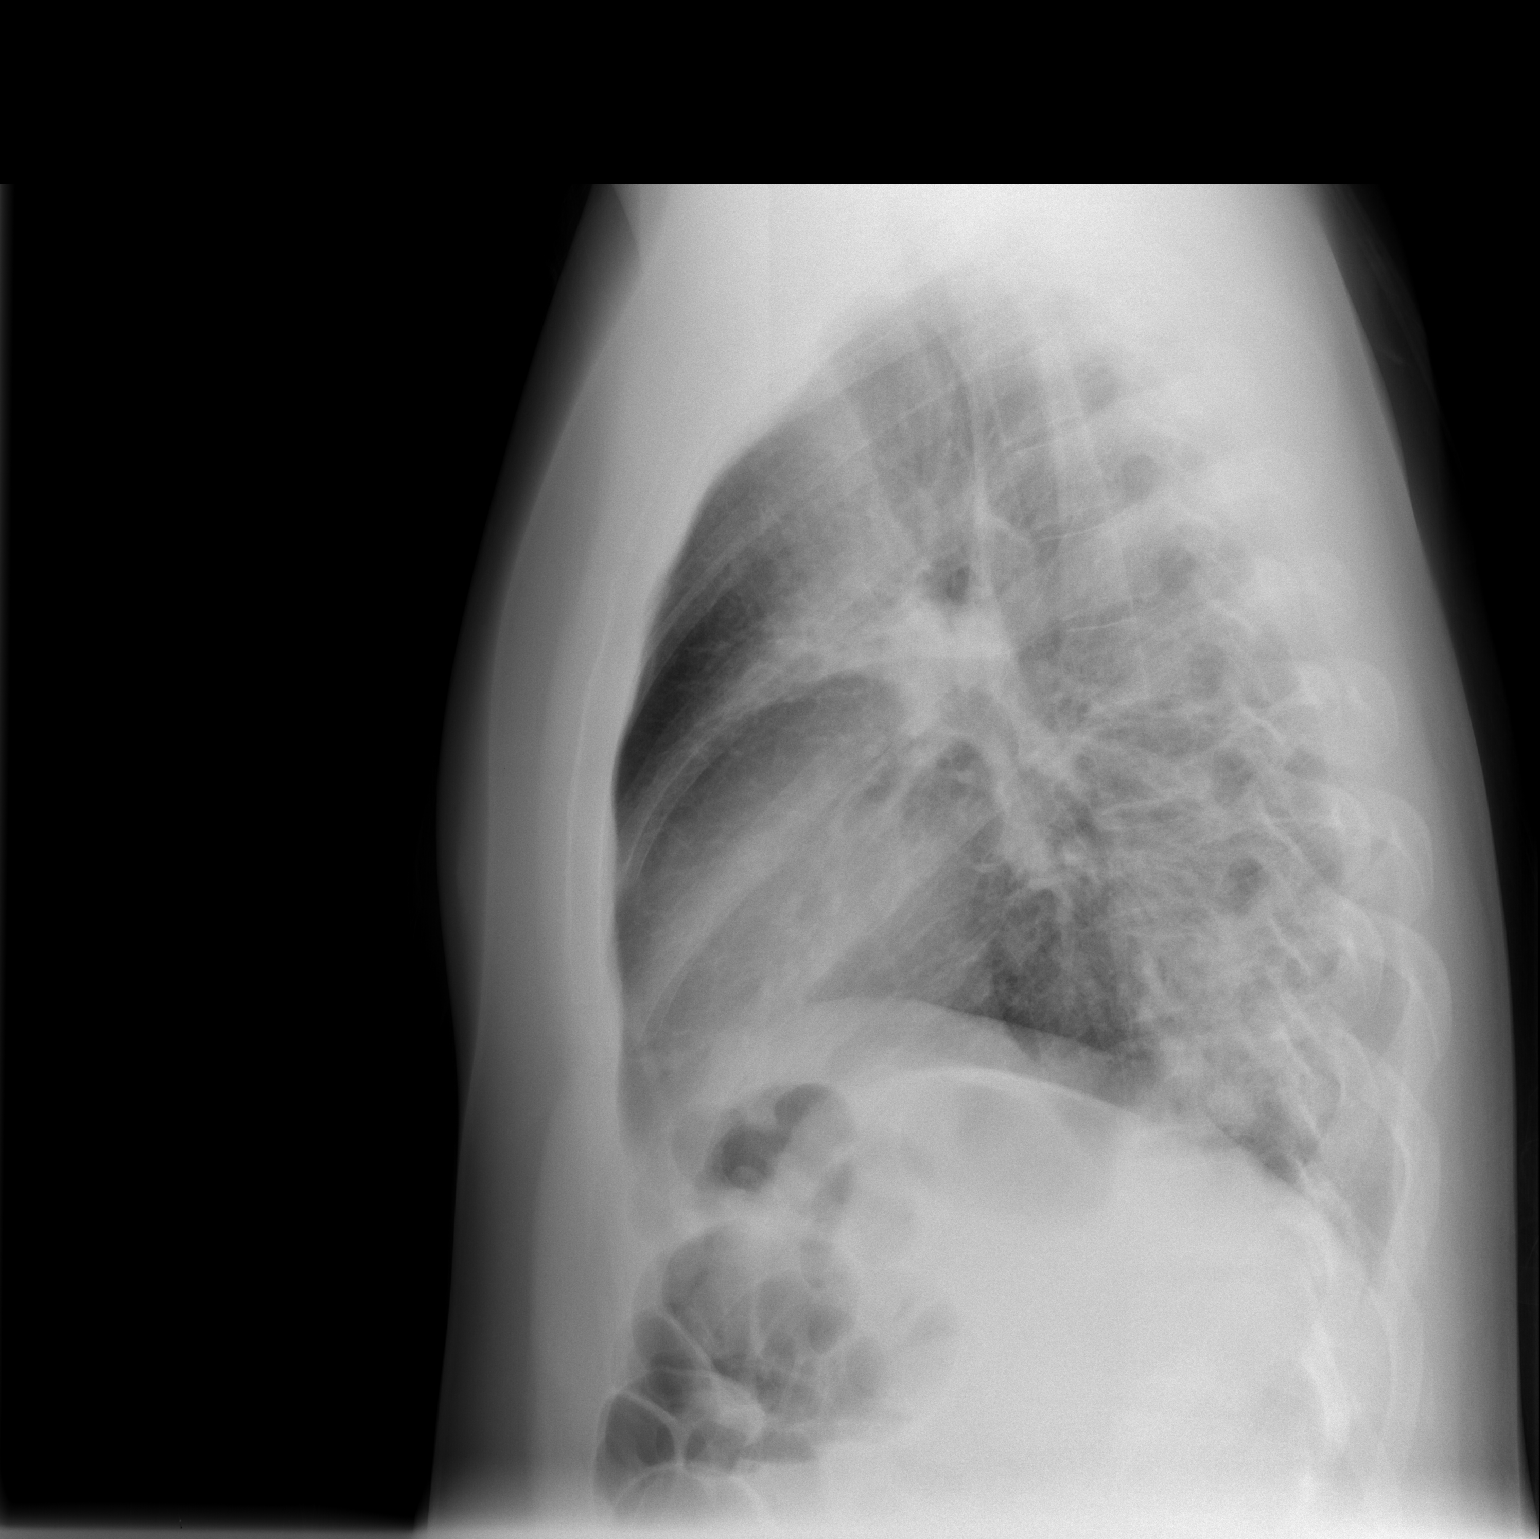

[2 of 2 positions shown; findings below may reference images not displayed]

FINDINGS: There has been partial resolution of   patchy airspace
opacities in the left lower lobe, right infrahilar region, and
right upper lobe.  No effusion.  Heart size normal.
IMPRESSION: Partial interval improvement in asymmetric airspace disease.

## 2011-07-13 ENCOUNTER — Encounter: Payer: Self-pay | Admitting: Internal Medicine

## 2011-07-23 ENCOUNTER — Ambulatory Visit: Payer: 59 | Admitting: Internal Medicine

## 2011-07-27 ENCOUNTER — Ambulatory Visit (INDEPENDENT_AMBULATORY_CARE_PROVIDER_SITE_OTHER): Payer: 59 | Admitting: Internal Medicine

## 2011-07-27 ENCOUNTER — Other Ambulatory Visit: Payer: 59

## 2011-07-27 ENCOUNTER — Encounter: Payer: Self-pay | Admitting: Internal Medicine

## 2011-07-27 VITALS — BP 118/62 | HR 62 | Temp 98.1°F | Ht 71.0 in | Wt 209.0 lb

## 2011-07-27 DIAGNOSIS — Z23 Encounter for immunization: Secondary | ICD-10-CM

## 2011-07-27 DIAGNOSIS — Z0000 Encounter for general adult medical examination without abnormal findings: Secondary | ICD-10-CM

## 2011-07-27 DIAGNOSIS — Z8619 Personal history of other infectious and parasitic diseases: Secondary | ICD-10-CM

## 2011-07-27 DIAGNOSIS — F411 Generalized anxiety disorder: Secondary | ICD-10-CM

## 2011-07-27 MED ORDER — ALPRAZOLAM 0.5 MG PO TABS: 0.5000 mg | ORAL_TABLET | Freq: Three times a day (TID) | ORAL | Status: DC | PRN

## 2011-07-27 NOTE — Assessment & Plan Note (Signed)
rx for prn xanax but also ?ADD overlap - precipitated by resuming school Send for behav health battery testing - cponsider ADD tx if +evidence for same

## 2011-07-27 NOTE — Patient Instructions (Signed)
It was good to see you today. Immunizations updated today: Hep B, MMR, Flu and Tdap Test(s) ordered today for varicella titer. Your results will be mailed to you after review (48-72hours after test completion). If any changes need to be made, you will be notified at that time. Use medication at bedtime as needed for sleep and we'll make referral for ADD testing as we discussed ([facility]). Our office will contact you regarding appointment(s) once made. If these tests show ADD, make follow up visit to change medication treatment as/if needed Good luck with school!

## 2011-07-27 NOTE — Progress Notes (Signed)
Subjective:    Patient ID: Matthew Glover, male    DOB: 1985/11/05, 25 y.o.   MRN: [id number]  HPI  patient is here today for annual physical. Patient feels well. 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 ANXIETY   . ALLERGIC RHINITIS   . PNEUMONIA     bilateral, req bipap 04/2010 hosp  . INSOMNIA   . DEPRESSION     grief rxn after sudden dealth of father 08/2009   Family History  Problem Relation Age of Onset  . Diabetes Other     positive in parent & grandparent not sure which 1  . Hyperlipidemia Other   . Heart disease Other   . Hypertension Other    History  Substance Use Topics  . Smoking status: Never Smoker   . Smokeless tobacco: Not on file  . Alcohol Use: 0.0 oz/week     2/week   Review of Systems Constitutional: Negative for fever.  Respiratory: Negative for cough and shortness of breath.   Cardiovascular: Negative for chest pain.  Gastrointestinal: Negative for abdominal pain.  Musculoskeletal: Negative for gait problem.  Skin: Negative for rash.  Neurological: Negative for dizziness.  No other specific complaints in a complete review of systems (except as listed in HPI above).     Objective:   Physical Exam BP 118/62  Pulse 62  Temp(Src) 98.1 F (36.7 C) (Oral)  Ht 5\' 11"  (1.803 m)  Wt 209 lb (94.802 kg)  BMI 29.15 kg/m2  SpO2 98% Wt Readings from Last 3 Encounters:  07/27/11 209 lb (94.802 kg)  01/22/11 205 lb 12.8 oz (93.35 kg)  09/28/10 214 lb (97.07 kg)    Constitutional: mildly overweight. appears well-developed and well-nourished. No distress.  HENT: NCAT, ears: TMs clear, hearing intact; OP clear without erythema, good dentition Eyes: PERRL, EOMI - no icterus or  injection Neck: Normal range of motion. Neck supple. No JVD present. No thyromegaly present.  Cardiovascular: Normal rate, regular rhythm and normal heart sounds.  No murmur heard. no BLE edema Pulmonary/Chest: Effort normal and breath sounds normal. No respiratory distress. no wheezes.  Abdominal: Soft. Bowel sounds are normal. Patient exhibits no distension. There is no tenderness.  Musculoskeletal: Normal range of motion. Patient exhibits no gross deformities Neurological: he is alert and oriented to person, place, and time. No cranial nerve deficit. Coordination normal.  Skin: Skin is warm and dry.  No erythema or ulceration.  Psychiatric: he has a normal mood and affect. behavior is normal. Judgment and thought content normal.   Lab Results  Component Value Date   WBC 5.2 09/28/2010   HGB 14.9 09/28/2010   HCT 42.7 09/28/2010   PLT 203.0 09/28/2010   CHOL 198 09/28/2010   TRIG 93.0 09/28/2010   HDL 37.70* 09/28/2010   ALT 25 09/28/2010   AST 23 09/28/2010   NA 137 09/28/2010   K 4.1 09/28/2010   CL 99 09/28/2010   CREATININE 0.8 09/28/2010   BUN 14 09/28/2010   CO2 29 09/28/2010   TSH 1.98 09/28/2010        Assessment & Plan:  CPX - v70.0 - Patient has been counseled on age-appropriate routine health concerns for screening and prevention.  These are reviewed and up-to-date. Immunizations are up-dated today for school (EMT certification). Labs ordered for varicella titer - immunize if not imune

## 2011-07-30 LAB — VARICELLA ZOSTER ANTIBODY, IGG: Varicella IgG: 2.23 {ISR} — ABNORMAL HIGH

## 2011-09-03 ENCOUNTER — Ambulatory Visit (INDEPENDENT_AMBULATORY_CARE_PROVIDER_SITE_OTHER): Payer: 59 | Admitting: Psychology

## 2011-09-03 DIAGNOSIS — F411 Generalized anxiety disorder: Secondary | ICD-10-CM

## 2011-09-17 ENCOUNTER — Ambulatory Visit: Payer: 59 | Admitting: Psychology

## 2011-09-19 ENCOUNTER — Ambulatory Visit (INDEPENDENT_AMBULATORY_CARE_PROVIDER_SITE_OTHER): Payer: 59 | Admitting: Psychology

## 2011-09-19 DIAGNOSIS — F411 Generalized anxiety disorder: Secondary | ICD-10-CM

## 2011-10-15 ENCOUNTER — Other Ambulatory Visit (INDEPENDENT_AMBULATORY_CARE_PROVIDER_SITE_OTHER): Payer: 59

## 2011-10-15 ENCOUNTER — Ambulatory Visit: Payer: 59 | Admitting: Psychology

## 2011-10-15 DIAGNOSIS — F411 Generalized anxiety disorder: Secondary | ICD-10-CM

## 2011-10-17 ENCOUNTER — Telehealth: Payer: Self-pay | Admitting: *Deleted

## 2011-10-17 MED ORDER — AMPHETAMINE-DEXTROAMPHETAMINE 10 MG PO TABS: 10.0000 mg | ORAL_TABLET | Freq: Every day | ORAL | Status: DC

## 2011-10-17 NOTE — Telephone Encounter (Signed)
MD received report back from Dr. Dellia Cloud. Wanted to inform pt that she will go ahead & rx Adderal 10 mg for Add. Notified pt with md response. Pt states when he pick rx up will make f/u appt then...10/17/11@12 :13pm/LMB

## 2011-10-17 NOTE — Telephone Encounter (Signed)
A user error has taken place open by mistake

## 2011-11-03 ENCOUNTER — Emergency Department (HOSPITAL_COMMUNITY)
Admission: EM | Admit: 2011-11-03 | Discharge: 2011-11-03 | Disposition: A | Discharge status: Home / Self Care | Payer: 59 | Source: Home / Self Care | Attending: Family Medicine | Admitting: Family Medicine

## 2011-11-03 ENCOUNTER — Encounter (HOSPITAL_COMMUNITY): Payer: Self-pay | Admitting: Emergency Medicine

## 2011-11-03 DIAGNOSIS — S61019A Laceration without foreign body of unspecified thumb without damage to nail, initial encounter: Secondary | ICD-10-CM

## 2011-11-03 DIAGNOSIS — S61209A Unspecified open wound of unspecified finger without damage to nail, initial encounter: Secondary | ICD-10-CM

## 2011-11-03 NOTE — ED Notes (Signed)
Reports tetanus is current

## 2011-11-03 NOTE — ED Notes (Signed)
Laceration to right thumb, straight cut.  Bleeding controlled, accidentally cut with knife

## 2011-11-03 NOTE — ED Notes (Signed)
Reports tetanus current, within 6 months

## 2011-11-03 NOTE — ED Provider Notes (Signed)
History     CSN: [id number]  Arrival date & time 11/03/11  1129   First MD Initiated Contact with Patient 11/03/11 1228      Chief Complaint  Patient presents with  . Laceration    (Consider location/radiation/quality/duration/timing/severity/associated sxs/prior treatment) HPI Comments: Patient reports he cut his right thumb on his pocket knife just pta. Bleeding controlled. Immunizations utd. No numbness or tingling  The history is provided by the patient.    Past Medical History  Diagnosis Date  . ANXIETY   . ALLERGIC RHINITIS   . PNEUMONIA     bilateral, req bipap 04/2010 hosp  . INSOMNIA   . DEPRESSION     grief rxn after sudden dealth of father 08/2009    Past Surgical History  Procedure Date  . Appendectomy 2010    Family History  Problem Relation Age of Onset  . Diabetes Other     positive in parent & grandparent not sure which 1  . Hyperlipidemia Other   . Heart disease Other   . Hypertension Other     History  Substance Use Topics  . Smoking status: Never Smoker   . Smokeless tobacco: Not on file  . Alcohol Use: 0.0 oz/week     2/week      Review of Systems  Constitutional: Negative.   HENT: Negative.   Respiratory: Negative.   Cardiovascular: Negative.     Allergies  Ibuprofen and Phenergan  Home Medications   Current Outpatient Rx  Name Route Sig Dispense Refill  . ALPRAZOLAM 0.5 MG PO TABS Oral Take 1 tablet (0.5 mg total) by mouth 3 (three) times daily as needed for sleep or anxiety. 30 tablet 0  . AMPHETAMINE-DEXTROAMPHETAMINE 10 MG PO TABS Oral Take 1 tablet (10 mg total) by mouth daily. 30 tablet 0    BP 124/74  Pulse 70  Temp(Src) 98.3 F (36.8 C) (Oral)  Resp 18  SpO2 99%  Physical Exam  Nursing note and vitals reviewed. Constitutional: He appears well-developed and well-nourished. No distress.  Cardiovascular: Normal rate and regular rhythm.   Pulmonary/Chest: Effort normal and breath sounds normal.    Musculoskeletal:       1 cm full skin laceration dorsomedial aspect of the thumb. Bleeding controlled. Nail not involved.     ED Course  Procedures (including critical care time) Wound cleansed. dermabond applied with good closure Labs Reviewed - No data to display No results found.   1. Thumb laceration       MDM         Randa Spike, MD 11/03/11 1318

## 2011-12-05 ENCOUNTER — Encounter: Payer: Self-pay | Admitting: Internal Medicine

## 2011-12-05 ENCOUNTER — Ambulatory Visit (INDEPENDENT_AMBULATORY_CARE_PROVIDER_SITE_OTHER): Payer: 59 | Admitting: Internal Medicine

## 2011-12-05 VITALS — BP 118/62 | HR 67 | Temp 97.5°F | Wt 214.8 lb

## 2011-12-05 DIAGNOSIS — F988 Other specified behavioral and emotional disorders with onset usually occurring in childhood and adolescence: Secondary | ICD-10-CM

## 2011-12-05 MED ORDER — AMPHETAMINE-DEXTROAMPHET ER 20 MG PO CP24: 20.0000 mg | ORAL_CAPSULE | ORAL | Status: DC

## 2011-12-05 NOTE — Progress Notes (Signed)
  Subjective:    Patient ID: Matthew Glover, male    DOB: [birth year], 26 y.o.   MRN: [id number]  HPI   follow up ADD Dx during eval for anxiety fall 2012 -  Precipitated by resuming school - [employer] for [employer] - to complete courses 02/2012 hx ADD with med tx age [age] - adderall started with good relief, but "wears off" mid day Grades are good - not using or needing xanax as "anxiety symptoms gone"  Past Medical History  Diagnosis Date  . ANXIETY   . ALLERGIC RHINITIS   . PNEUMONIA     bilateral, req bipap 04/2010 hosp  . INSOMNIA   . DEPRESSION     grief rxn after sudden dealth of father 08/2009  . ADD (attention deficit disorder)     dx clarified 08/2011>start adderall   Review of Systems  Respiratory: Negative for cough and shortness of breath.   Cardiovascular: Negative for chest pain.      Objective:   Physical Exam  BP 118/62  Pulse 67  Temp(Src) 97.5 F (36.4 C) (Oral)  Wt 214 lb 12.8 oz (97.433 kg)  SpO2 97% Wt Readings from Last 3 Encounters:  12/05/11 214 lb 12.8 oz (97.433 kg)  07/27/11 209 lb (94.802 kg)  01/22/11 205 lb 12.8 oz (93.35 kg)   Constitutional: mildly overweight. appears well-developed and well-nourished. No distress.  Cardiovascular: Normal rate, regular rhythm and normal heart sounds.  No murmur heard. no BLE edema Pulmonary/Chest: Effort normal and breath sounds normal. No respiratory distress. no wheezes.  Psychiatric: he has a normal mood and affect. behavior is normal. Judgment and thought content normal.   Lab Results  Component Value Date   WBC 5.2 09/28/2010   HGB 14.9 09/28/2010   HCT 42.7 09/28/2010   PLT 203.0 09/28/2010   CHOL 198 09/28/2010   TRIG 93.0 09/28/2010   HDL 37.70* 09/28/2010   ALT 25 09/28/2010   AST 23 09/28/2010   NA 137 09/28/2010   K 4.1 09/28/2010   CL 99 09/28/2010   CREATININE 0.8 09/28/2010   BUN 14 09/28/2010   CO2 29 09/28/2010   TSH 1.98 09/28/2010       Assessment & Plan:   See problem list.  Medications and labs reviewed today.

## 2011-12-05 NOTE — Patient Instructions (Signed)
It was good to see you today. Change Adderall to extended release and increase dose to 20 mg daily - prescription provided for you today on same.  Okay to call for refills every 30 days and as needed - call if your focus is unimproved or if other problems with medication Please schedule followup in 12 months for recheck and physical, call sooner if problems.

## 2011-12-05 NOTE — Assessment & Plan Note (Signed)
Diagnosis clarified fall 2012 during evaluation by psychology for anxiety Started Adderall for same. 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

## 2011-12-25 ENCOUNTER — Encounter: Payer: Self-pay | Admitting: Endocrinology

## 2011-12-25 ENCOUNTER — Ambulatory Visit (INDEPENDENT_AMBULATORY_CARE_PROVIDER_SITE_OTHER): Payer: 59 | Admitting: Endocrinology

## 2011-12-25 VITALS — BP 122/76 | HR 70 | Temp 98.2°F

## 2011-12-25 DIAGNOSIS — L408 Other psoriasis: Secondary | ICD-10-CM

## 2011-12-25 DIAGNOSIS — L409 Psoriasis, unspecified: Secondary | ICD-10-CM | POA: Insufficient documentation

## 2011-12-25 MED ORDER — HALOBETASOL PROPIONATE 0.05 % EX CREA: TOPICAL_CREAM | Freq: Four times a day (QID) | CUTANEOUS | Status: DC | PRN

## 2011-12-25 MED ORDER — AMPHETAMINE-DEXTROAMPHET ER 20 MG PO CP24: 20.0000 mg | ORAL_CAPSULE | ORAL | Status: DC
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
## 2011-12-25 NOTE — Patient Instructions (Addendum)
i have sent a prescription to your pharmacy, for a steroid cream. I hope you feel better soon.  If you don't feel better by next week, please call back. Here is a refill of your adderall.

## 2011-12-26 ENCOUNTER — Ambulatory Visit: Payer: 59 | Admitting: Internal Medicine

## 2012-02-20 ENCOUNTER — Other Ambulatory Visit: Payer: Self-pay

## 2012-02-20 MED ORDER — AMPHETAMINE-DEXTROAMPHET ER 20 MG PO CP24: 20.0000 mg | ORAL_CAPSULE | ORAL | Status: DC

## 2012-02-20 NOTE — Telephone Encounter (Signed)
Pt informed via VM, Rx in cabinet for pt pick up  

## 2012-10-17 ENCOUNTER — Encounter: Payer: Self-pay | Admitting: Internal Medicine

## 2012-10-17 ENCOUNTER — Ambulatory Visit (INDEPENDENT_AMBULATORY_CARE_PROVIDER_SITE_OTHER): Payer: 59 | Admitting: Internal Medicine

## 2012-10-17 VITALS — BP 130/82 | HR 71 | Temp 98.9°F | Ht 71.0 in | Wt 202.1 lb

## 2012-10-17 DIAGNOSIS — J019 Acute sinusitis, unspecified: Secondary | ICD-10-CM

## 2012-10-17 MED ORDER — AMOXICILLIN-POT CLAVULANATE 875-125 MG PO TABS: 1.0000 | ORAL_TABLET | Freq: Two times a day (BID) | ORAL | Status: AC

## 2012-10-17 MED ORDER — HYDROCODONE-HOMATROPINE 5-1.5 MG/5ML PO SYRP: 5.0000 mL | ORAL_SOLUTION | Freq: Four times a day (QID) | ORAL | Status: DC | PRN

## 2012-10-17 NOTE — Progress Notes (Signed)
  Subjective:    HPI  complains of head cold symptoms, ?sinusitus Onset >1 week ago, initially improved then relapsing and worse symptoms  First associated with rhinorrhea, sneezing, sore throat, mild headache and low grade fever Now increase max sinus pressure, upper teeth pain and mild-mod nasal congestion, yellow-green discharge No relief with OTC meds Precipitated by sick contacts and weather change  Past Medical History  Diagnosis Date  . ANXIETY   . ALLERGIC RHINITIS   . PNEUMONIA     bilateral, req bipap 04/2010 hosp  . INSOMNIA   . DEPRESSION     grief rxn after sudden dealth of father 08/2009  . ADD (attention deficit disorder)     dx clarified 08/2011>start adderall    Review of Systems Constitutional: No night sweats, no unexpected weight change Pulmonary: No pleurisy or hemoptysis Cardiovascular: No chest pain or palpitations     Objective:   Physical Exam BP 130/82  Pulse 71  Temp 98.9 F (37.2 C) (Oral)  Ht 5\' 11"  (1.803 m)  Wt 202 lb 1.9 oz (91.681 kg)  BMI 28.19 kg/m2  SpO2 97% GEN: mildly ill appearing and audible head congestion HENT: NCAT, mild maxillary sinus tenderness bilaterally, nares with thick discharge and turbinate swelling, oropharynx mild erythema and PND, no exudate Eyes: Vision grossly intact, no conjunctivitis Lungs: Clear to auscultation without rhonchi or wheeze, no increased work of breathing Cardiovascular: Regular rate and rhythm, no bilateral edema  Lab Results  Component Value Date   WBC 5.2 09/28/2010   HGB 14.9 09/28/2010   HCT 42.7 09/28/2010   PLT 203.0 09/28/2010   GLUCOSE 87 09/28/2010   CHOL 198 09/28/2010   TRIG 93.0 09/28/2010   HDL 37.70* 09/28/2010   LDLCALC 142* 09/28/2010   ALT 25 09/28/2010   AST 23 09/28/2010   NA 137 09/28/2010   K 4.1 09/28/2010   CL 99 09/28/2010   CREATININE 0.8 09/28/2010   BUN 14 09/28/2010   CO2 29 09/28/2010   TSH 1.98 09/28/2010      Assessment & Plan:  Viral URI > progression to acute  sinusitis Cough, postnasal drip related to above   Empiric antibiotics prescribed due to symptom duration greater than 7 days and progression despite OTC symptomatic care Prescription cough suppression - new prescriptions done Symptomatic care with Tylenol or Advil, decongestants, antihistamine, hydration and rest -  Saline irrigation and salt gargle advised as needed

## 2012-10-17 NOTE — Patient Instructions (Addendum)
It was good to see you today. augmentin antibiotics and prescription cough syrup - Your prescription(s) have been submitted to your pharmacy. Please take as directed and contact our office if you believe you are having problem(s) with the medication(s). Use decongestant like sudafed and nasal saline rinse as needed Alternate between ibuprofen and tylenol for aches, pain and fever symptoms as discussed Hydrate, rest and call if worse or unimprovedSinusitis Sinusitis is redness, soreness, and swelling (inflammation) of the paranasal sinuses. Paranasal sinuses are air pockets within the bones of your face (beneath the eyes, the middle of the forehead, or above the eyes). In healthy paranasal sinuses, mucus is able to drain out, and air is able to circulate through them by way of your nose. However, when your paranasal sinuses are inflamed, mucus and air can become trapped. This can allow bacteria and other germs to grow and cause infection. Sinusitis can develop quickly and last only a short time (acute) or continue over a long period (chronic). Sinusitis that lasts for more than 12 weeks is considered chronic.   CAUSES   Causes of sinusitis include:  Allergies.   Structural abnormalities, such as displacement of the cartilage that separates your nostrils (deviated septum), which can decrease the air flow through your nose and sinuses and affect sinus drainage.   Functional abnormalities, such as when the small hairs (cilia) that line your sinuses and help remove mucus do not work properly or are not present.  SYMPTOMS   Symptoms of acute and chronic sinusitis are the same. The primary symptoms are pain and pressure around the affected sinuses. Other symptoms include:  Upper toothache.   Earache.   Headache.   Bad breath.   Decreased sense of smell and taste.   A cough, which worsens when you are lying flat.   Fatigue.   Fever.   Thick drainage from your nose, which often is green and  may contain pus (purulent).   Swelling and warmth over the affected sinuses.  DIAGNOSIS   Your caregiver will perform a physical exam. During the exam, your caregiver may:  Look in your nose for signs of abnormal growths in your nostrils (nasal polyps).   Tap over the affected sinus to check for signs of infection.   View the inside of your sinuses (endoscopy) with a special imaging device with a light attached (endoscope), which is inserted into your sinuses.  If your caregiver suspects that you have chronic sinusitis, one or more of the following tests may be recommended:  Allergy tests.   Nasal culture A sample of mucus is taken from your nose and sent to a lab and screened for bacteria.   Nasal cytology A sample of mucus is taken from your nose and examined by your caregiver to determine if your sinusitis is related to an allergy.  TREATMENT   Most cases of acute sinusitis are related to a viral infection and will resolve on their own within 10 days. Sometimes medicines are prescribed to help relieve symptoms (pain medicine, decongestants, nasal steroid sprays, or saline sprays).   However, for sinusitis related to a bacterial infection, your caregiver will prescribe antibiotic medicines. These are medicines that will help kill the bacteria causing the infection.   Rarely, sinusitis is caused by a fungal infection. In theses cases, your caregiver will prescribe antifungal medicine. For some cases of chronic sinusitis, surgery is needed. Generally, these are cases in which sinusitis recurs more than 3 times per year, despite other treatments. HOME  CARE INSTRUCTIONS    Drink plenty of water. Water helps thin the mucus so your sinuses can drain more easily.   Use a humidifier.   Inhale steam 3 to 4 times a day (for example, sit in the bathroom with the shower running).   Apply a warm, moist washcloth to your face 3 to 4 times a day, or as directed by your caregiver.   Use saline  nasal sprays to help moisten and clean your sinuses.   Take over-the-counter or prescription medicines for pain, discomfort, or fever only as directed by your caregiver.  SEEK IMMEDIATE MEDICAL CARE IF:  You have increasing pain or severe headaches.   You have nausea, vomiting, or drowsiness.   You have swelling around your face.   You have vision problems.   You have a stiff neck.   You have difficulty breathing.  MAKE SURE YOU:    Understand these instructions.   Will watch your condition.   Will get help right away if you are not doing well or get worse.  Document Released: 10/15/2005 Document Revised: 01/07/2012 Document Reviewed: 10/30/2011 [facility] Patient Information 2013 [place], [place].

## 2013-05-20 ENCOUNTER — Ambulatory Visit (INDEPENDENT_AMBULATORY_CARE_PROVIDER_SITE_OTHER): Payer: Self-pay | Admitting: *Deleted

## 2013-05-20 DIAGNOSIS — Z23 Encounter for immunization: Secondary | ICD-10-CM

## 2013-05-27 ENCOUNTER — Ambulatory Visit (INDEPENDENT_AMBULATORY_CARE_PROVIDER_SITE_OTHER): Payer: 59 | Admitting: Internal Medicine

## 2013-05-27 ENCOUNTER — Encounter: Payer: Self-pay | Admitting: Internal Medicine

## 2013-05-27 ENCOUNTER — Ambulatory Visit (INDEPENDENT_AMBULATORY_CARE_PROVIDER_SITE_OTHER)
Admission: RE | Admit: 2013-05-27 | Discharge: 2013-05-27 | Disposition: A | Discharge status: Home / Self Care | Payer: 59 | Source: Ambulatory Visit | Attending: Internal Medicine | Admitting: Internal Medicine

## 2013-05-27 VITALS — BP 112/72 | HR 58 | Temp 97.9°F | Wt 213.0 lb

## 2013-05-27 DIAGNOSIS — W450XXA Nail entering through skin, initial encounter: Secondary | ICD-10-CM

## 2013-05-27 DIAGNOSIS — M79671 Pain in right foot: Secondary | ICD-10-CM

## 2013-05-27 DIAGNOSIS — F988 Other specified behavioral and emotional disorders with onset usually occurring in childhood and adolescence: Secondary | ICD-10-CM

## 2013-05-27 DIAGNOSIS — F411 Generalized anxiety disorder: Secondary | ICD-10-CM

## 2013-05-27 DIAGNOSIS — M79609 Pain in unspecified limb: Secondary | ICD-10-CM

## 2013-05-27 DIAGNOSIS — W268XXA Contact with other sharp object(s), not elsewhere classified, initial encounter: Secondary | ICD-10-CM

## 2013-05-27 MED ORDER — AMPHETAMINE-DEXTROAMPHET ER 20 MG PO CP24: 20.0000 mg | ORAL_CAPSULE | ORAL | Status: DC

## 2013-05-27 MED ORDER — ALPRAZOLAM 0.5 MG PO TABS: 0.5000 mg | ORAL_TABLET | Freq: Three times a day (TID) | ORAL | Status: DC | PRN

## 2013-05-27 NOTE — Progress Notes (Signed)
Subjective:    Patient ID: Matthew Glover, male    DOB: [birth year], 27 y.o.   MRN: [id number]  HPI Patient presents today after stepping on a nail 1 week ago with his right foot. The nail went into the ball of his right foot, but he was able to get it out. He received his tetanus shot 7 days ago. He believes he got the whole nail out, but he now feels as if he is walking on a marble, and the area is very tender when he puts pressure on it. He has noticed some swelling and redness of the ball of his foot, improved since injury. Also he notes a knot in the ball of his foot at site of injury. He denies pain in the joints of his toes, loss of sensation in foot, inability to walk, fever, and chills.  Also requests refills  Past Medical History  Diagnosis Date  . ANXIETY   . ALLERGIC RHINITIS   . PNEUMONIA     bilateral, req bipap 04/2010 hosp  . INSOMNIA   . DEPRESSION     grief rxn after sudden dealth of father 08/2009  . ADD (attention deficit disorder)     dx clarified 08/2011>start adderall   Family History  Problem Relation Age of Onset  . Diabetes Other     positive in parent & grandparent not sure which 1  . Hyperlipidemia Other   . Heart disease Other   . Hypertension Other    History  Substance Use Topics  . Smoking status: Never Smoker   . Smokeless tobacco: Not on file  . Alcohol Use: 0.0 oz/week     Comment: 2/week     Review of Systems  Constitutional: Negative for fever and unexpected weight change.  Musculoskeletal: Positive for gait problem. Negative for back pain.       Objective:   Physical Exam  Constitutional: He is oriented to person, place, and time. He appears well-developed and well-nourished.  Musculoskeletal:  Well healed puncture wound at MTP 3rd MT head, mildly tender "knot" noted at site of injury. Thickening without erythema, no local swelling surrounding this knot. Patient has full ROM and sensation of joints of toes bilaterally.    Neurological: He is alert and oriented to person, place, and time.  Skin: Skin is warm and dry. No rash noted. No erythema.  Psychiatric: He has a normal mood and affect. His behavior is normal.   Lab Results  Component Value Date   WBC 5.2 09/28/2010   HGB 14.9 09/28/2010   HCT 42.7 09/28/2010   PLT 203.0 09/28/2010   GLUCOSE 87 09/28/2010   CHOL 198 09/28/2010   TRIG 93.0 09/28/2010   HDL 37.70* 09/28/2010   LDLCALC 142* 09/28/2010   ALT 25 09/28/2010   AST 23 09/28/2010   NA 137 09/28/2010   K 4.1 09/28/2010   CL 99 09/28/2010   CREATININE 0.8 09/28/2010   BUN 14 09/28/2010   CO2 29 09/28/2010   TSH 1.98 09/28/2010        Assessment & Plan:  1. Puncture injury of right foot- Will obtain Xray of right foot to ensure there is no foreign body remaining in the wound. He is reassured that the knot is likely the result of scar tissue from the injury. As of now, there are no signs of infection in the foot. Patient can use OTC pain medications prn for pain and can continue to soak foot in warm water. He should follow  up if pain or swelling worsens or he develops fever, or he is not improving over the next 1-2 weeks.  Concha Se, PA-S  I have personally reviewed this case with PA student. I also personally examined this patient. I agree with history and findings as documented above. I reviewed, discussed and approve of the assessment and plan as listed above. Rene Paci, MD

## 2013-05-27 NOTE — Assessment & Plan Note (Signed)
rx for prn xanax with ADD overlap - exacerbated by social stressors Generally stable - refills today as requested

## 2013-05-27 NOTE — Assessment & Plan Note (Signed)
Diagnosis clarified fall 2012 during evaluation by psychology for anxiety Started Adderall for same.  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

## 2013-05-27 NOTE — Patient Instructions (Signed)
It was good to see you today. Test(s) ordered today. Your results will be released to MyChart (or called to you) after review, usually within 72hours after test completion. If any changes need to be made, you will be notified at that same time. Medications reviewed and updated, no changes recommended at this time. Refill on medication(s) as discussed today.  Foreign Body When the skin is cut or punctured and some object is left in the tissue under the skin, that object is called a "foreign body". A foreign body could be a wood splinter, a thorn, a sliver of metal, a shard of glass, a cactus needle or the tip of a pencil. In most instances, your caregiver will recommend that the foreign body be removed. If it is not removed, infection, abscess formation, an allergic reaction, chronic pain and disability can occur over time. Sometimes, foreign bodies (particularly very small ones) can be difficult to locate. Your caregiver may recommend x-rays or ultrasound imaging to help find them. If removal is not successful, there may be a need to see a surgeon who might suggest further exploration in the operating room. Occasionally, tiny bits of metallic foreign material (such as shrapnel) are not removed, if it is felt that there would be no harm in leaving them untouched. HOME CARE INSTRUCTIONS  Rest the injured area and keep it elevated until all the pain and swelling are gone.  You will need a tetanus vaccination if you have not had one in the last 5 years.  Return to this facility, see your caregiver or follow-up as instructed in 2 days. SEEK IMMEDIATE MEDICAL CARE IF:   You develop increasing redness or swelling of the skin near the wound.  You develop drainage of pus from the wound.  You have persistent pain or loss of motion.  You have red streaks extending above or below the wound location. MAKE SURE YOU:   Understand these instructions.  Will watch your condition.  Will get help right  away if you are not doing well or get worse. Document Released: 10/15/2005 Document Revised: 01/07/2012 Document Reviewed: 09/16/2009 [facility] Patient Information 2014 [place], [place].

## 2013-08-04 ENCOUNTER — Ambulatory Visit (INDEPENDENT_AMBULATORY_CARE_PROVIDER_SITE_OTHER): Payer: 59 | Admitting: Internal Medicine

## 2013-08-04 ENCOUNTER — Encounter: Payer: Self-pay | Admitting: Internal Medicine

## 2013-08-04 VITALS — BP 130/72 | HR 55 | Temp 98.2°F | Wt 219.8 lb

## 2013-08-04 DIAGNOSIS — J309 Allergic rhinitis, unspecified: Secondary | ICD-10-CM

## 2013-08-04 DIAGNOSIS — Z23 Encounter for immunization: Secondary | ICD-10-CM

## 2013-08-04 DIAGNOSIS — J069 Acute upper respiratory infection, unspecified: Secondary | ICD-10-CM

## 2013-08-04 NOTE — Progress Notes (Signed)
HPI  Pt presents to the clinic today with c/o cold symptoms x 1 weeks. The worst part is the sore throat and dry cough. He does produce yellow sputum. He denies fever, chills or body aches. He has tried Tylenol OTC which does not seem to help.  He does have a history of allergies but no asthma. He does have sick contacts.  Review of Systems      Past Medical History  Diagnosis Date  . ANXIETY   . ALLERGIC RHINITIS   . PNEUMONIA     bilateral, req bipap 04/2010 hosp  . INSOMNIA   . DEPRESSION     grief rxn after sudden dealth of father 08/2009  . ADD (attention deficit disorder)     dx clarified 08/2011>start adderall    Family History  Problem Relation Age of Onset  . Diabetes Other     positive in parent & grandparent not sure which 1  . Hyperlipidemia Other   . Heart disease Other   . Hypertension Other     History   Social History  . Marital Status: Single    Spouse Name: N/A    Number of Children: N/A  . Years of Education: N/A   Occupational History  . Not on file.   Social History Main Topics  . Smoking status: Never Smoker   . Smokeless tobacco: Not on file  . Alcohol Use: 0.0 oz/week     Comment: 2/week  . Drug Use: No  . Sexual Activity: Not on file   Other Topics Concern  . Not on file   Social History Narrative   Single, has girlfriend. 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 Ibuprofen   . Promethazine Hcl     Pt states     Constitutional: Positive headache, fatigue. Denies fever or abrupt weight changes.  HEENT:  Positive sore throat. Denies eye redness, eye pain, pressure behind the eyes, facial pain, nasal congestion, ear pain, ringing in the ears, wax buildup, runny nose or bloody nose. Respiratory: Positive cough. Denies difficulty breathing or shortness of breath.  Cardiovascular: Denies chest pain, chest tightness, palpitations or swelling in the hands or feet.    No other specific complaints in a complete review of systems (except as listed in HPI above).  Objective:   BP 130/72  Pulse 55  Temp(Src) 98.2 F (36.8 C) (Oral)  Wt 219 lb 12 oz (99.678 kg)  BMI 30.66 kg/m2  SpO2 98% Wt Readings from Last 3 Encounters:  08/04/13 219 lb 12 oz (99.678 kg)  05/27/13 213 lb (96.616 kg)  10/17/12 202 lb 1.9 oz (91.681 kg)     General: Appears his stated age, well developed, well nourished in NAD. HEENT: Head: normal shape and size; Eyes: sclera white, no icterus, conjunctiva pink, PERRLA and EOMs intact; Ears: Tm's gray and intact, normal light reflex; Nose: mucosa pink and moist, septum midline; Throat/Mouth: + PND. Teeth present, mucosa erythematous and moist, no exudate noted, no lesions or ulcerations noted.  Neck: Mild cervical lymphadenopathy. Neck supple, trachea midline. No massses, lumps or thyromegaly present.  Cardiovascular: Normal rate and rhythm. S1,S2 noted.  No murmur, rubs or gallops noted. No JVD or BLE edema. No carotid bruits noted. Pulmonary/Chest: Normal effort and positive vesicular breath sounds. No respiratory distress. No wheezes, rales or ronchi noted.      Assessment & Plan:   Upper Respiratory Infection, likely viral:  Get some rest and drink plenty of water Do salt water gargles for the sore throat Supportive measures Given hx of allergic rhinitis- start Zyrtec 10 mg OTC OTC cough suppressants  RTC as needed or if symptoms persist.

## 2013-08-04 NOTE — Progress Notes (Signed)
HPI: Pt presents to office today with complaints of nasal/chest congestion, sputum production, and sore throat. Symptoms started about a week ago. Pt denies ear pain, shortness of breath, teeth pain, or headache. Pt has tried OTC tylenol sinus as well as taking a few left over antibiotic tablets he had from previous infection. Pt states he felt some better with this.   Past Medical History  Diagnosis Date  . ANXIETY   . ALLERGIC RHINITIS   . PNEUMONIA     bilateral, req bipap 04/2010 hosp  . INSOMNIA   . DEPRESSION     grief rxn after sudden dealth of father 08/2009  . ADD (attention deficit disorder)     dx clarified 08/2011>start adderall    Current Outpatient Prescriptions  Medication Sig Dispense Refill  . ALPRAZolam (XANAX) 0.5 MG tablet Take 1 tablet (0.5 mg total) by mouth 3 (three) times daily as needed for anxiety.  30 tablet  0  . amphetamine-dextroamphetamine (ADDERALL XR) 20 MG 24 hr capsule Take 1 capsule (20 mg total) by mouth every morning. Fill only once every 30 days  30 capsule  0   No current facility-administered medications for this visit.    Allergies  Allergen Reactions  . Ibuprofen   . Promethazine Hcl     Pt states    ROS: Constitutional: Denies fever, malaise, fatigue, headache or abrupt weight changes.  HEENT: Endorses nasal congestion, green nasal discharge, and sore throat. Denies eye pain, eye redness, ear pain, ringing in the ears, wax buildup, runny nose, or bloody nose. Respiratory: Endorses cough and sputum production. Denies difficulty breathing or shortness of breath. Cardiovascular: Denies chest pain, chest tightness, palpitations or swelling in the hands or feet.     No other specific complaints in a complete review of systems (except as listed in HPI above).  PE:  BP 130/72  Pulse 55  Temp(Src) 98.2 F (36.8 C) (Oral)  Wt 219 lb 12 oz (99.678 kg)  BMI 30.66 kg/m2  SpO2 98% Wt Readings from Last 3 Encounters:  08/04/13 219 lb 12 oz  (99.678 kg)  05/27/13 213 lb (96.616 kg)  10/17/12 202 lb 1.9 oz (91.681 kg)    General: Appears their stated age, well developed, well nourished in NAD. HEENT: Head: normal shape and size; Eyes: sclera white, no icterus, conjunctiva pink, PERRLA and EOMs intact; Ears: Tm's gray and intact, normal light reflex; Nose: mucosa pink and moist, septum midline; Throat/Mouth: Teeth present, mucosa pink and moist, no lesions or ulcerations noted.  Neck: Normal range of motion. Neck supple, trachea midline. No massses, lumps or thyromegaly present.  Cardiovascular: Normal rate and rhythm. S1,S2 noted.  No murmur, rubs or gallops noted. No JVD or BLE edema. No carotid bruits noted. Pulmonary/Chest: Normal effort and positive vesicular breath sounds. No respiratory distress. No wheezes, rales or ronchi noted.  Psychiatric: Mood and affect normal. Behavior is normal. Judgment and thought content normal.      Assessment and Plan: Viral URI vs Allergic Rhinitis Continue Tylenol Sinus for symptom relief Do not take extra antibiotic tablets Zyrtec 10mg  by mouth daily Neti pot for congestion relief Follow up on Monday 10/13 if no symptoms relief Received flu vaccine today  Canaan Holzer S, Student-NP

## 2013-08-04 NOTE — Patient Instructions (Signed)

## 2014-02-27 ENCOUNTER — Encounter: Payer: Self-pay | Admitting: Family Medicine

## 2014-02-27 ENCOUNTER — Ambulatory Visit (INDEPENDENT_AMBULATORY_CARE_PROVIDER_SITE_OTHER): Payer: 59 | Admitting: Family Medicine

## 2014-02-27 VITALS — BP 100/68 | Temp 97.9°F | Wt 217.0 lb

## 2014-02-27 DIAGNOSIS — L255 Unspecified contact dermatitis due to plants, except food: Secondary | ICD-10-CM

## 2014-02-27 MED ORDER — PREDNISONE 20 MG PO TABS: ORAL_TABLET | ORAL | Status: DC

## 2014-02-27 NOTE — Progress Notes (Signed)
Pre visit review using our clinic review tool, if applicable. No additional management support is needed unless otherwise documented below in the visit note. 

## 2014-02-27 NOTE — Patient Instructions (Signed)
Poison Ivy Poison ivy is a inflammation of the skin (contact dermatitis) caused by touching the allergens on the leaves of the ivy plant following previous exposure to the plant. The rash usually appears 48 hours after exposure. The rash is usually bumps (papules) or blisters (vesicles) in a linear pattern. Depending on your own sensitivity, the rash may simply cause redness and itching, or it may also progress to blisters which may break open. These must be well cared for to prevent secondary bacterial (germ) infection, followed by scarring. Keep any open areas dry, clean, dressed, and covered with an antibacterial ointment if needed. The eyes may also get puffy. The puffiness is worst in the morning and gets better as the day progresses. This dermatitis usually heals without scarring, within 2 to 3 weeks without treatment. HOME CARE INSTRUCTIONS  Thoroughly wash with soap and water as soon as you have been exposed to poison ivy. You have about one half hour to remove the plant resin before it will cause the rash. This washing will destroy the oil or antigen on the skin that is causing, or will cause, the rash. Be sure to wash under your fingernails as any plant resin there will continue to spread the rash. Do not rub skin vigorously when washing affected area. Poison ivy cannot spread if no oil from the plant remains on your body. A rash that has progressed to weeping sores will not spread the rash unless you have not washed thoroughly. It is also important to wash any clothes you have been wearing as these may carry active allergens. The rash will return if you wear the unwashed clothing, even several days later. Avoidance of the plant in the future is the best measure. Poison ivy plant can be recognized by the number of leaves. Generally, poison ivy has three leaves with flowering branches on a single stem. Diphenhydramine may be purchased over the counter and used as needed for itching. Do not drive with  this medication if it makes you drowsy.Ask your caregiver about medication for children. SEEK MEDICAL CARE IF:  Open sores develop.  Redness spreads beyond area of rash.  You notice purulent (pus-like) discharge.  You have increased pain.  Other signs of infection develop (such as fever). Document Released: 10/12/2000 Document Revised: 01/07/2012 Document Reviewed: 08/31/2009 [employer] Patient Information 2014 [employer].  

## 2014-02-27 NOTE — Progress Notes (Signed)
Chief Complaint  Patient presents with  . Poison Ivy    HPI: ? Poison ivy: -started after weedeating a few days ago -itchy rash on hands, arms, legs, butt - has had poison ivy before -trying topical over the counter meds, but spreading -denies: fevers, chills, malaise, SOB, lesions on face, lesion on eyes of mouth ROS: See pertinent positives and negatives per HPI.  Past Medical History  Diagnosis Date  . ANXIETY   . ALLERGIC RHINITIS   . PNEUMONIA     bilateral, req bipap 04/2010 hosp  . INSOMNIA   . DEPRESSION     grief rxn after sudden dealth of father 08/2009  . ADD (attention deficit disorder)     dx clarified 08/2011>start adderall    Past Surgical History  Procedure Laterality Date  . Appendectomy  2010    Family History  Problem Relation Age of Onset  . Diabetes Other     positive in parent & grandparent not sure which 1  . Hyperlipidemia Other   . Heart disease Other   . Hypertension Other     History   Social History  . Marital Status: Single    Spouse Name: N/A    Number of Children: N/A  . Years of Education: N/A   Social History Main Topics  . Smoking status: Never Smoker   . Smokeless tobacco: None  . Alcohol Use: 0.0 oz/week     Comment: 2/week  . Drug Use: No  . Sexual Activity: None   Other Topics Concern  . None   Social History Narrative   Single, has girlfriend. 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 Fill only once every 30 days, Disp: 30 capsule, Rfl: 0 predniSONE (DELTASONE) 20 MG tablet, 60mg  for 3 days, then 40mg  for 3 days, then 20 mg for 5 days then stop, Disp: 20 tablet, Rfl: 0  EXAM:  Filed  Vitals:   02/27/14 1040  BP: 100/68  Temp: 97.9 F (36.6 C)    Body mass index is 30.28 kg/(m^2).  GENERAL: vitals reviewed and listed above, alert, oriented, appears well hydrated and in no acute distress  HEENT: atraumatic, conjunttiva clear, no obvious abnormalities on inspection of external nose and ears  NECK: no obvious masses on inspection    MS: moves all extremities without noticeable abnormality  PSYCH: pleasant and cooperative, no obvious depression or anxiety  ASSESSMENT AND PLAN:  Discussed the following assessment and plan:  Toxicodendron dermatitis - Plan: predniSONE (DELTASONE) 20 MG tablet  -discussed options, he opted for prednisone after discussion risks -Patient advised to return or notify a doctor immediately if symptoms worsen or persist or new concerns arise.  Patient Instructions  [facility] Poison ivy is a inflammation of the skin (contact dermatitis) caused by touching the allergens on the leaves of the ivy plant following previous exposure to the plant. The rash usually appears 48 hours after exposure. The rash is usually bumps (papules) or blisters (vesicles) in a linear pattern. Depending on your own sensitivity, the rash may simply cause redness and itching, or it may also progress to blisters which may break open. These must be well cared for to prevent  secondary bacterial (germ) infection, followed by scarring. Keep any open areas dry, clean, dressed, and covered with an antibacterial ointment if needed. The eyes may also get puffy. The puffiness is worst in the morning and gets better as the day progresses. This dermatitis usually heals without scarring, within 2 to 3 weeks without treatment. HOME CARE INSTRUCTIONS  Thoroughly wash with soap and water as soon as you have been exposed to poison ivy. You have about one half hour to remove the plant resin before it will cause the rash. This washing will destroy the oil or antigen on the skin that is  causing, or will cause, the rash. Be sure to wash under your fingernails as any plant resin there will continue to spread the rash. Do not rub skin vigorously when washing affected area. Poison ivy cannot spread if no oil from the plant remains on your body. A rash that has progressed to weeping sores will not spread the rash unless you have not washed thoroughly. It is also important to wash any clothes you have been wearing as these may carry active allergens. The rash will return if you wear the unwashed clothing, even several days later. Avoidance of the plant in the future is the best measure. Poison ivy plant can be recognized by the number of leaves. Generally, poison ivy has three leaves with flowering branches on a single stem. Diphenhydramine may be purchased over the counter and used as needed for itching. Do not drive with this medication if it makes you drowsy.Ask your caregiver about medication for children. SEEK MEDICAL CARE IF:  Open sores develop.  Redness spreads beyond area of rash.  You notice purulent (pus-like) discharge.  You have increased pain.  Other signs of infection develop (such as fever). Document Released: 10/12/2000 Document Revised: 01/07/2012 Document Reviewed: 08/31/2009 [facility] Patient Information 2014 [place]      Terressa KoyanagiHannah R. Kim

## 2014-05-26 IMAGING — CR DG FOOT 2V*R*
2 series · 2 of 2 positions shown · non-contrast
Comparison: None.

CLINICAL DATA: Stepped on a nail 1 week ago, pain ball of foot
under third metatarsal, rule out foreign body also the dorsal mid
right foot pain

RIGHT FOOT - 2 VIEW

[view not recorded (1 of 2)]
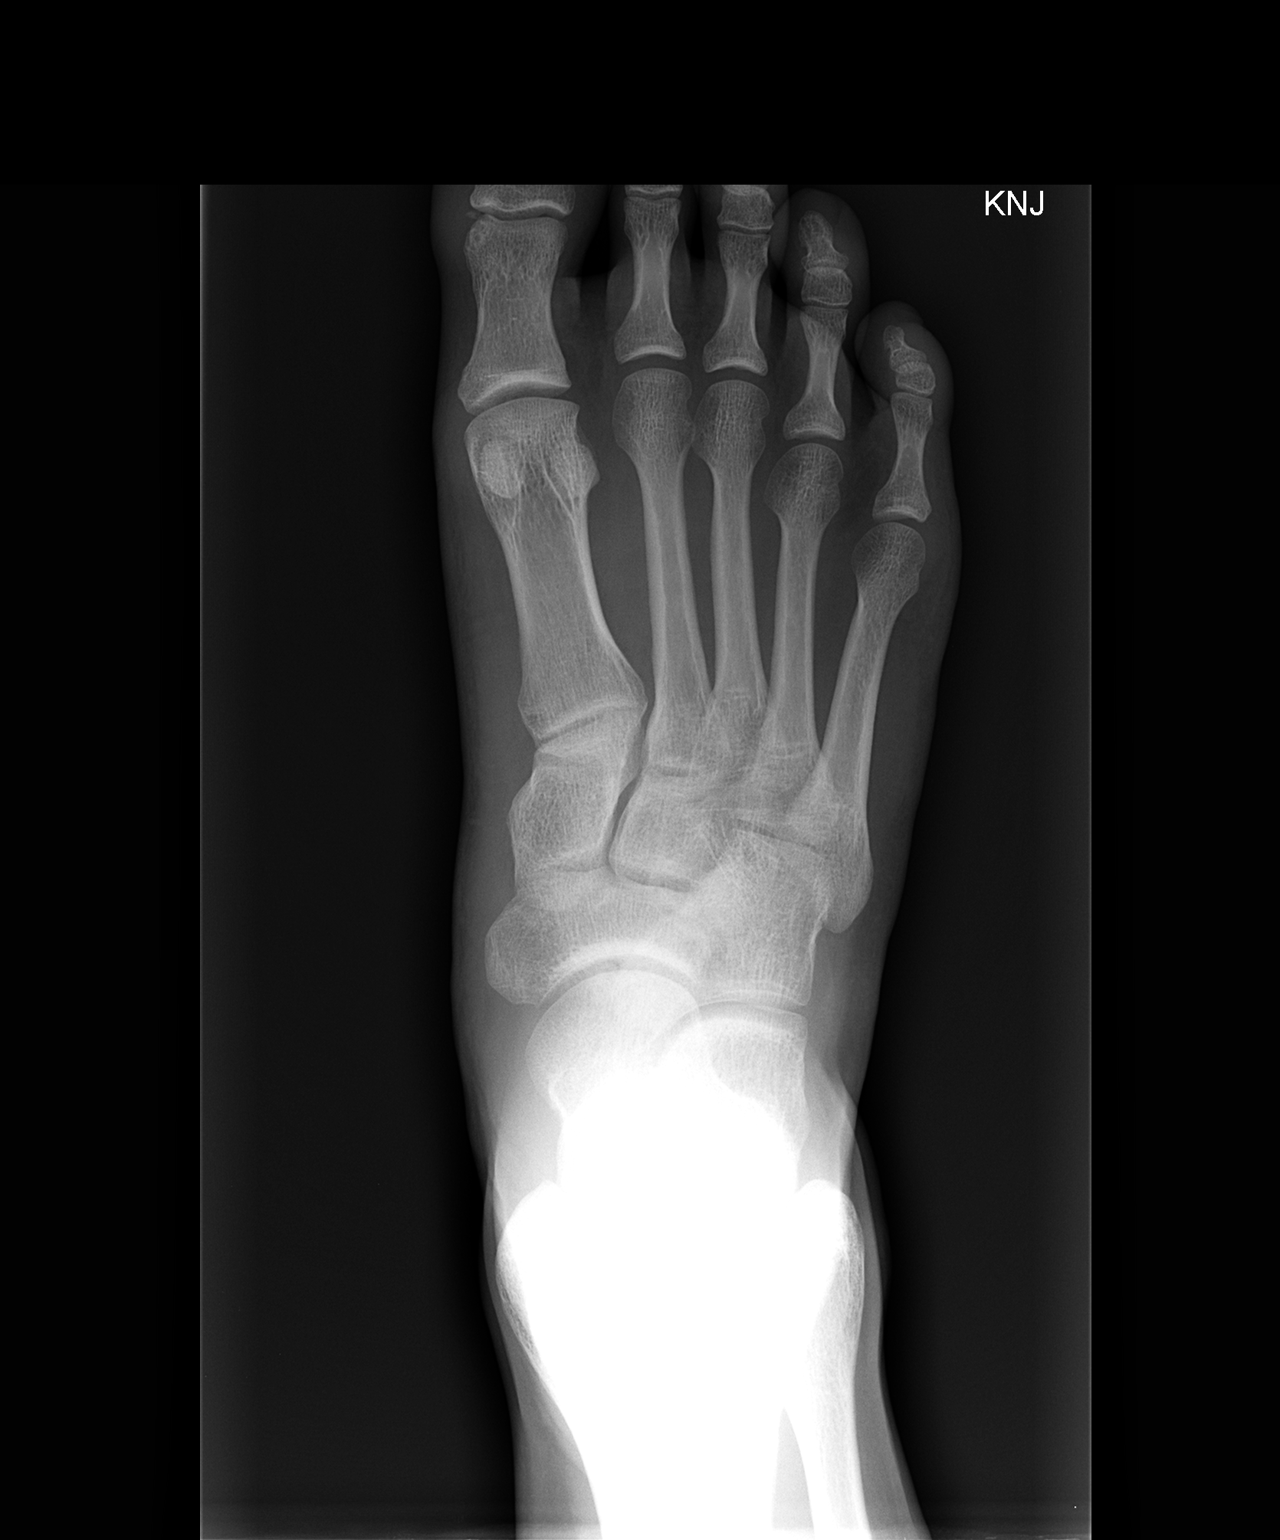

[view not recorded (2 of 2)]
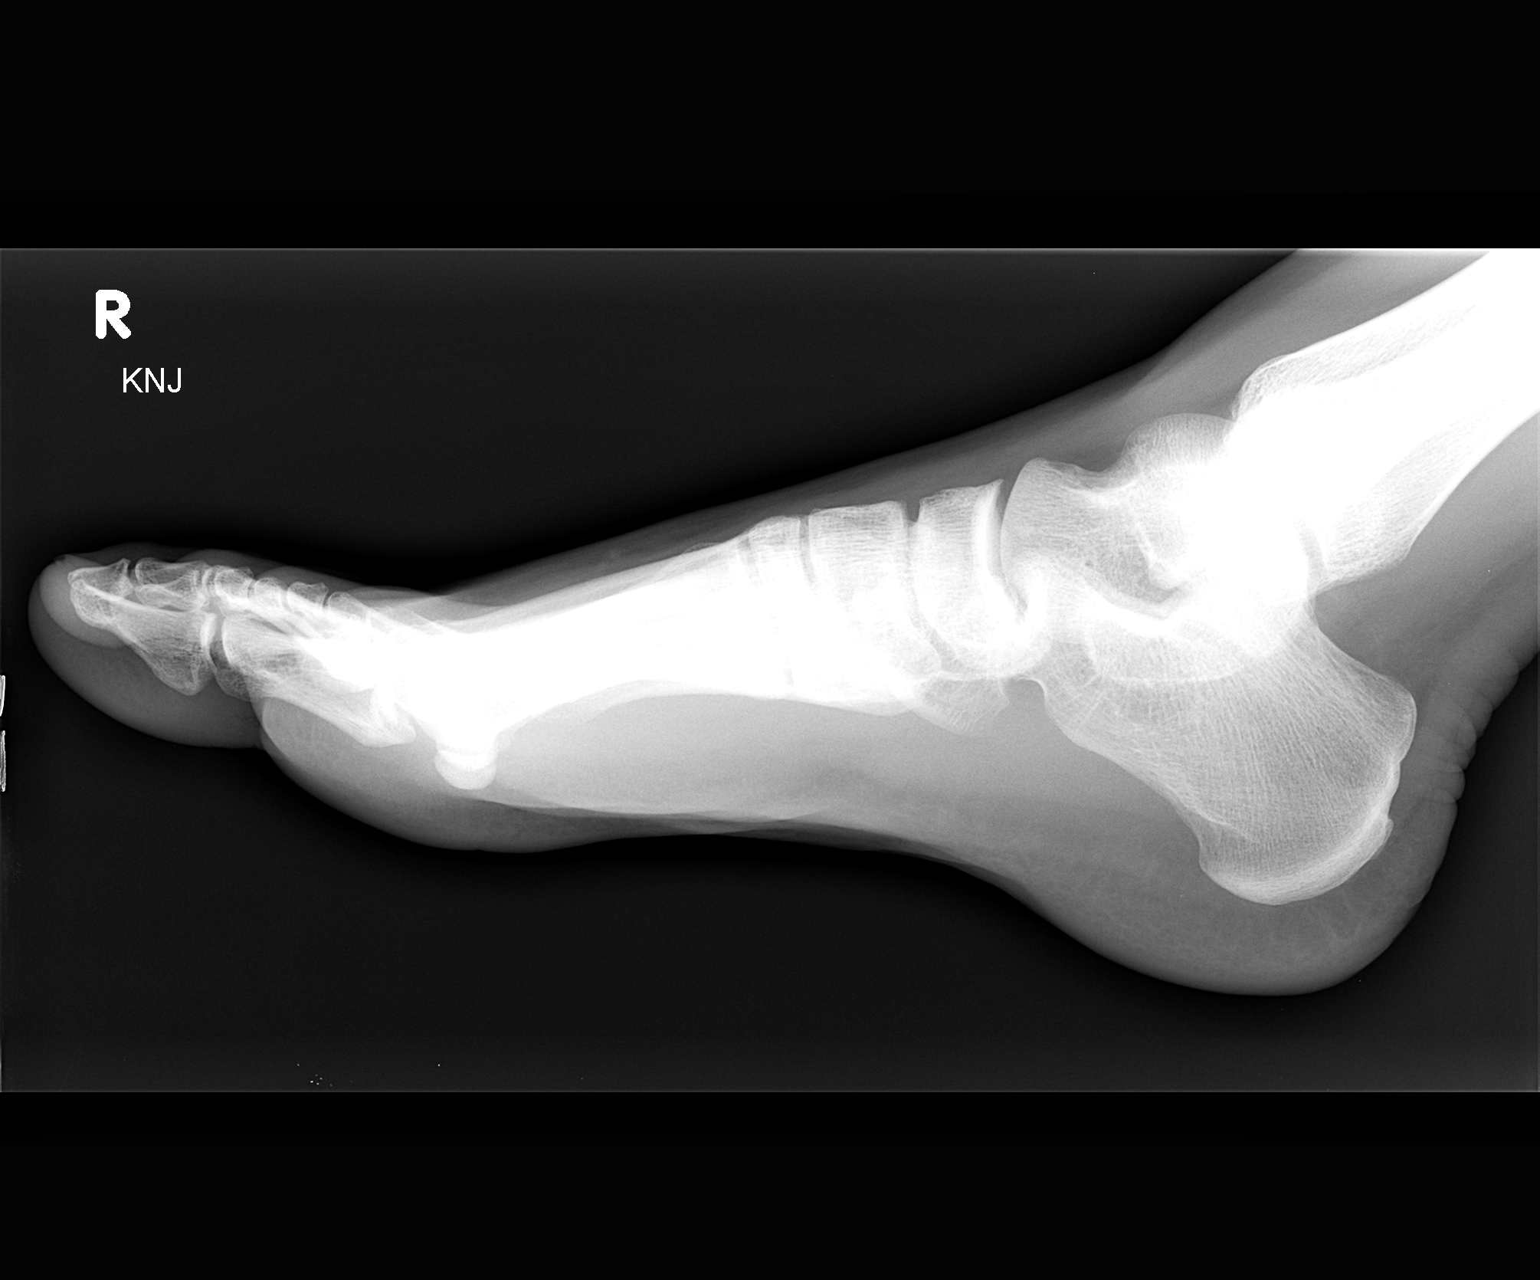

[2 of 2 positions shown; findings below may reference images not displayed]

FINDINGS: No fracture, dislocation, or foreign body.
IMPRESSION: Negative

## 2014-08-10 ENCOUNTER — Ambulatory Visit (INDEPENDENT_AMBULATORY_CARE_PROVIDER_SITE_OTHER): Payer: 59 | Admitting: Internal Medicine

## 2014-08-10 ENCOUNTER — Encounter: Payer: Self-pay | Admitting: Internal Medicine

## 2014-08-10 VITALS — BP 110/62 | HR 59 | Temp 98.1°F | Resp 13 | Wt 222.5 lb

## 2014-08-10 DIAGNOSIS — L255 Unspecified contact dermatitis due to plants, except food: Secondary | ICD-10-CM

## 2014-08-10 DIAGNOSIS — L259 Unspecified contact dermatitis, unspecified cause: Secondary | ICD-10-CM

## 2014-08-10 MED ORDER — MOMETASONE FUROATE 0.1 % EX OINT: TOPICAL_OINTMENT | Freq: Two times a day (BID) | CUTANEOUS | Status: DC

## 2014-08-10 MED ORDER — PREDNISONE 20 MG PO TABS: 20.0000 mg | ORAL_TABLET | Freq: Two times a day (BID) | ORAL | Status: DC

## 2014-08-10 MED ORDER — HYDROXYZINE HCL 10 MG PO TABS: ORAL_TABLET | ORAL | Status: DC

## 2014-08-10 NOTE — Patient Instructions (Signed)
   If you could possibly have been exposed to poison ivy; immediately shower & use soap liberally to remove oil 

## 2014-08-10 NOTE — Progress Notes (Signed)
   Subjective:    Patient ID: Matthew Glover, male    DOB: 1986/04/23, 28 y.o.   MRN: [id number]  HPI    He was [place] a [place] in the woods 08/06/14 and subsequently developed a rash over the left forearm arm & right wrist. He also developed some around the belt line. The rash was described as itching  There was minimal response to hydrocortisone cream and calamine lotion.  Lesions have  been oozing without signs of secondary infection.  He's had similar issues in the past after exposure to poison ivy.  Review of Systems  No associated itchy, watery eyes.  Swelling of the lips or tongue denied.  Shortness of breath, wheezing, or cough absent.  Fever ,chills , or sweats denied. Purulence absent.  Diarrhea not present.     Objective:   Physical Exam General appearance:good health ;well nourished; no acute distress or increased work of breathing is present.  No  lymphadenopathy about the head, neck, or axilla noted.   Eyes: No conjunctival inflammation or lid edema is present. There is no scleral icterus.  Ears:  External ear exam shows no significant lesions or deformities.  Otoscopic examination reveals clear canals, tympanic membranes are intact bilaterally without bulging, retraction, inflammation or discharge.  Nose:  External nasal examination shows no deformity or inflammation. Nasal mucosa are pink and moist without lesions or exudates. No septal dislocation or deviation.No obstruction to airflow.   Oral exam: Dental hygiene is good; lips and gums are healthy appearing.There is no oropharyngeal erythema or exudate noted.   Neck:  No deformities, thyromegaly, masses, or tenderness noted.     Heart:  Normal rate and regular rhythm. S1 and S2 normal without gallop, murmur, click, rub or other extra sounds.   Lungs:Chest clear to auscultation; no wheezes, rhonchi,rales ,or rubs present.No increased work of breathing.    Extremities:  No cyanosis, edema, or clubbing   noted    Skin: Warm & dry.  He has multiple scattered lesions over the left upper extremity. He has a larger papular lesion of the right wrist  He has multiple small punctate papular lesions around the waist without associated vesicles, pustules, or maceration.        Assessment & Plan:  #1 contact dermatitis most likely from poison ivy. The waist lesions suggest chigger infestation as they're not vesicular but rather small and papular.  See orders recommendations

## 2014-08-10 NOTE — Progress Notes (Signed)
Pre visit review using our clinic review tool, if applicable. No additional management support is needed unless otherwise documented below in the visit note. 

## 2014-09-21 ENCOUNTER — Telehealth: Payer: Self-pay | Admitting: Internal Medicine

## 2014-09-21 NOTE — Telephone Encounter (Signed)
Thanks for the update and work in

## 2014-09-21 NOTE — Telephone Encounter (Signed)
Opening become available on Dr Felicity CoyerLeschber schedule, now on schedule for 09/22/14 at 10 am.

## 2014-09-21 NOTE — Telephone Encounter (Signed)
Requesting OV today with Dr Felicity CoyerLeschber. Pt cannot sleep, wife walked out on him, very very upset. Requesting work in this AM with Dr Felicity CoyerLeschber. Pls advise.  [phone]

## 2014-09-22 ENCOUNTER — Encounter: Payer: Self-pay | Admitting: Internal Medicine

## 2014-09-22 ENCOUNTER — Ambulatory Visit (INDEPENDENT_AMBULATORY_CARE_PROVIDER_SITE_OTHER): Payer: 59 | Admitting: Internal Medicine

## 2014-09-22 VITALS — BP 130/78 | HR 77 | Temp 98.2°F | Ht 71.0 in | Wt 209.0 lb

## 2014-09-22 DIAGNOSIS — F411 Generalized anxiety disorder: Secondary | ICD-10-CM

## 2014-09-22 DIAGNOSIS — G4700 Insomnia, unspecified: Secondary | ICD-10-CM

## 2014-09-22 MED ORDER — ALPRAZOLAM 0.5 MG PO TABS: 0.5000 mg | ORAL_TABLET | Freq: Three times a day (TID) | ORAL | Status: DC | PRN

## 2014-09-22 MED ORDER — ZOLPIDEM TARTRATE 5 MG PO TABS: 5.0000 mg | ORAL_TABLET | Freq: Every evening | ORAL | Status: DC | PRN

## 2014-09-22 NOTE — Progress Notes (Signed)
Subjective:    Patient ID: Josefina DoKevin R Haack, male    DOB: 1986-02-07, 28 y.o.   MRN: [id number]  HPI  Patient here for "stress" and insomnia Acute onset 2-3 weeks ago following sudden and unexpected separation from wife Also reviewed chronic medical issues and interval medical events  Past Medical History  Diagnosis Date  . ANXIETY   . ALLERGIC RHINITIS   . PNEUMONIA     bilateral, req bipap 04/2010 hosp  . INSOMNIA   . DEPRESSION     grief rxn after sudden dealth of father 08/2009  . ADD (attention deficit disorder)     dx clarified 08/2011>start adderall    Review of Systems  Constitutional: Negative for fever and unexpected weight change.  Respiratory: Negative for cough and shortness of breath.   Cardiovascular: Negative for chest pain and leg swelling.  Neurological: Negative for dizziness and headaches.  Psychiatric/Behavioral: Positive for sleep disturbance, dysphoric mood and decreased concentration. Negative for suicidal ideas, hallucinations, behavioral problems, confusion and self-injury. The patient is nervous/anxious. The patient is not hyperactive.        Objective:   Physical Exam  BP 130/78 mmHg  Pulse 77  Temp(Src) 98.2 F (36.8 C) (Oral)  Ht 5\' 11"  (1.803 m)  Wt 209 lb (94.802 kg)  BMI 29.16 kg/m2  SpO2 97% Wt Readings from Last 3 Encounters:  09/22/14 209 lb (94.802 kg)  08/10/14 222 lb 8 oz (100.925 kg)  02/27/14 217 lb (98.431 kg)   Constitutional: he appears well-developed and well-nourished. No distress.  Neck: Normal range of motion. Neck supple. No JVD present. No thyromegaly present.  Cardiovascular: Normal rate, regular rhythm and normal heart sounds.  No murmur heard. No BLE edema. Pulmonary/Chest: Effort normal and breath sounds normal. No respiratory distress. he has no wheezes.  Psychiatric: he has a mildly anxious (appropriate for situation) mood and affect. His behavior is normal. Judgment and thought content normal.   Lab  Results  Component Value Date   WBC 5.2 09/28/2010   HGB 14.9 09/28/2010   HCT 42.7 09/28/2010   PLT 203.0 09/28/2010   GLUCOSE 87 09/28/2010   CHOL 198 09/28/2010   TRIG 93.0 09/28/2010   HDL 37.70* 09/28/2010   LDLCALC 142* 09/28/2010   ALT 25 09/28/2010   AST 23 09/28/2010   NA 137 09/28/2010   K 4.1 09/28/2010   CL 99 09/28/2010   CREATININE 0.8 09/28/2010   BUN 14 09/28/2010   CO2 29 09/28/2010   TSH 1.98 09/28/2010    Dg Foot 2 Views Right  05/27/2013   *RADIOLOGY REPORT*  Clinical Data: Stepped on a nail 1 week ago, pain ball of foot under third metatarsal, rule out foreign body also the dorsal mid right foot pain  RIGHT FOOT - 2 VIEW  Comparison: None.  Findings: No fracture, dislocation, or foreign body.  IMPRESSION: Negative   Original Report Authenticated By: Esperanza Heiraymond Rubner, M.D.       Assessment & Plan:   Problem List Items Addressed This Visit    Anxiety state - Primary    previously rx for Ambien, then prn xanax during grief reaction following sudden death of dad 11/201 - Now acute stress following wife leaving him 2.5 weeks ago, married 4 mo ago but together >6years Verified no SI/HI - reports not using EtOH to self medicate Pending appt with counseling Dellia Cloud([place]) 09/28/14 Ok to resume Ambien prn with xanax prn - new rx done Reminded not to use both at same time  Support offered at length    Relevant Medications      ALPRAZolam Prudy Feeler(XANAX) tablet   Insomnia    Stress induced with wife leaving See anxiety

## 2014-09-22 NOTE — Assessment & Plan Note (Addendum)
previously rx for Ambien, then prn xanax during grief reaction following sudden death of dad 11/201 - Now acute stress following wife leaving him 2.5 weeks ago, married 4 mo ago but together >6years Verified no SI/HI - reports not using EtOH to self medicate Pending appt with counseling Dellia Cloud([place]) 09/28/14 Ok to resume Ambien prn with xanax prn - new rx done Reminded not to use both at same time Support offered at length

## 2014-09-22 NOTE — Progress Notes (Signed)
Pre visit review using our clinic review tool, if applicable. No additional management support is needed unless otherwise documented below in the visit note. 

## 2014-09-22 NOTE — Patient Instructions (Addendum)
It was good to see you today.  We have reviewed your prior records including labs and tests today  Medications reviewed and updated Okay for Ambien at bedtime as needed for sleep; Xanax as needed for anxiety/nerves during the day Please do not use Ambien and Xanax at the same time and avoid use with alcohol Your prescription(s) have been submitted to your pharmacy. Please take as directed and contact our office if you believe you are having problem(s) with the medication(s).  Work excuse note for yesterday provided as requested  Follow-up with Dr. Cheryln Manly December 1 as scheduled  Please call if worsening symptoms despite medication or if other problems arise  Stress and Stress Management Stress is a normal reaction to life events. It is what you feel when life demands more than you are used to or more than you can handle. Some stress can be useful. For example, the stress reaction can help you catch the last bus of the day, study for a test, or meet a deadline at work. But stress that occurs too often or for too long can cause problems. It can affect your emotional health and interfere with relationships and normal daily activities. Too much stress can weaken your immune system and increase your risk for physical illness. If you already have a medical problem, stress can make it worse. CAUSES  All sorts of life events may cause stress. An event that causes stress for one person may not be stressful for another person. Major life events commonly cause stress. These may be positive or negative. Examples include losing your job, moving into a new home, getting married, having a baby, or losing a loved one. Less obvious life events may also cause stress, especially if they occur day after day or in combination. Examples include working long hours, driving in traffic, caring for children, being in debt, or being in a difficult relationship. SIGNS AND SYMPTOMS Stress may cause emotional symptoms  including, the following:  Anxiety. This is feeling worried, afraid, on edge, overwhelmed, or out of control.  Anger. This is feeling irritated or impatient.  Depression. This is feeling sad, down, helpless, or guilty.  Difficulty focusing, remembering, or making decisions. Stress may cause physical symptoms, including the following:   Aches and pains. These may affect your head, neck, back, stomach, or other areas of your body.  Tight muscles or clenched jaw.  Low energy or trouble sleeping. Stress may cause unhealthy behaviors, including the following:   Eating to feel better (overeating) or skipping meals.  Sleeping too little, too much, or both.  Working too much or putting off tasks (procrastination).  Smoking, drinking alcohol, or using drugs to feel better. DIAGNOSIS  Stress is diagnosed through an assessment by your health care provider. Your health care provider will ask questions about your symptoms and any stressful life events.Your health care provider will also ask about your medical history and may order blood tests or other tests. Certain medical conditions and medicine can cause physical symptoms similar to stress. Mental illness can cause emotional symptoms and unhealthy behaviors similar to stress. Your health care provider may refer you to a mental health professional for further evaluation.  TREATMENT  Stress management is the recommended treatment for stress.The goals of stress management are reducing stressful life events and coping with stress in healthy ways.  Techniques for reducing stressful life events include the following:  Stress identification. Self-monitor for stress and identify what causes stress for you. These skills may help  you to avoid some stressful events.  Time management. Set your priorities, keep a calendar of events, and learn to say "no." These tools can help you avoid making too many commitments. Techniques for coping with stress  include the following:  Rethinking the problem. Try to think realistically about stressful events rather than ignoring them or overreacting. Try to find the positives in a stressful situation rather than focusing on the negatives.  Exercise. Physical exercise can release both physical and emotional tension. The key is to find a form of exercise you enjoy and do it regularly.  Relaxation techniques. These relax the body and mind. Examples include yoga, meditation, tai chi, biofeedback, deep breathing, progressive muscle relaxation, listening to music, being out in nature, journaling, and other hobbies. Again, the key is to find one or more that you enjoy and can do regularly.  Healthy lifestyle. Eat a balanced diet, get plenty of sleep, and do not smoke. Avoid using alcohol or drugs to relax.  Strong support network. Spend time with family, friends, or other people you enjoy being around.Express your feelings and talk things over with someone you trust. Counseling or talktherapy with a mental health professional may be helpful if you are having difficulty managing stress on your own. Medicine is typically not recommended for the treatment of stress.Talk to your health care provider if you think you need medicine for symptoms of stress. HOME CARE INSTRUCTIONS  Keep all follow-up visits as directed by your health care provider.  Take all medicines as directed by your health care provider. SEEK MEDICAL CARE IF:  Your symptoms get worse or you start having new symptoms.  You feel overwhelmed by your problems and can no longer manage them on your own. SEEK IMMEDIATE MEDICAL CARE IF:  You feel like hurting yourself or someone else. Document Released: 04/10/2001 Document Revised: 03/01/2014 Document Reviewed: 06/09/2013 [facility] Patient Information 2015 [place], [place]. This information is not intended to replace advice given to you by your health care provider. Make sure you discuss any  questions you have with your health care provider.

## 2014-09-22 NOTE — Assessment & Plan Note (Signed)
Stress induced with wife leaving See anxiety

## 2014-09-28 ENCOUNTER — Ambulatory Visit (INDEPENDENT_AMBULATORY_CARE_PROVIDER_SITE_OTHER): Payer: 59 | Admitting: Psychology

## 2014-09-28 DIAGNOSIS — F411 Generalized anxiety disorder: Secondary | ICD-10-CM

## 2014-10-07 ENCOUNTER — Ambulatory Visit (INDEPENDENT_AMBULATORY_CARE_PROVIDER_SITE_OTHER): Payer: 59 | Admitting: Psychology

## 2014-10-07 DIAGNOSIS — F411 Generalized anxiety disorder: Secondary | ICD-10-CM

## 2014-10-21 ENCOUNTER — Ambulatory Visit (INDEPENDENT_AMBULATORY_CARE_PROVIDER_SITE_OTHER): Payer: 59 | Admitting: Psychology

## 2014-10-21 DIAGNOSIS — F411 Generalized anxiety disorder: Secondary | ICD-10-CM

## 2014-11-02 ENCOUNTER — Ambulatory Visit (INDEPENDENT_AMBULATORY_CARE_PROVIDER_SITE_OTHER): Payer: 59 | Admitting: Psychology

## 2014-11-02 DIAGNOSIS — F348 Other persistent mood [affective] disorders: Secondary | ICD-10-CM

## 2014-11-16 ENCOUNTER — Ambulatory Visit (INDEPENDENT_AMBULATORY_CARE_PROVIDER_SITE_OTHER): Payer: 59 | Admitting: Psychology

## 2014-11-16 DIAGNOSIS — F411 Generalized anxiety disorder: Secondary | ICD-10-CM

## 2014-12-03 ENCOUNTER — Ambulatory Visit (INDEPENDENT_AMBULATORY_CARE_PROVIDER_SITE_OTHER): Payer: 59 | Admitting: Psychology

## 2014-12-03 DIAGNOSIS — F411 Generalized anxiety disorder: Secondary | ICD-10-CM

## 2014-12-24 ENCOUNTER — Ambulatory Visit: Payer: 59 | Admitting: Psychology

## 2015-01-06 ENCOUNTER — Ambulatory Visit (INDEPENDENT_AMBULATORY_CARE_PROVIDER_SITE_OTHER): Payer: 59 | Admitting: Psychology

## 2015-01-06 DIAGNOSIS — F411 Generalized anxiety disorder: Secondary | ICD-10-CM | POA: Diagnosis not present

## 2015-01-27 ENCOUNTER — Ambulatory Visit (INDEPENDENT_AMBULATORY_CARE_PROVIDER_SITE_OTHER): Payer: 59 | Admitting: Psychology

## 2015-01-27 DIAGNOSIS — F411 Generalized anxiety disorder: Secondary | ICD-10-CM

## 2015-03-01 ENCOUNTER — Ambulatory Visit (INDEPENDENT_AMBULATORY_CARE_PROVIDER_SITE_OTHER): Payer: Self-pay | Admitting: Internal Medicine

## 2015-03-01 ENCOUNTER — Encounter: Payer: Self-pay | Admitting: Internal Medicine

## 2015-03-01 VITALS — BP 128/88 | HR 72 | Temp 99.0°F | Resp 18 | Ht 71.0 in | Wt 205.1 lb

## 2015-03-01 DIAGNOSIS — J029 Acute pharyngitis, unspecified: Secondary | ICD-10-CM | POA: Insufficient documentation

## 2015-03-01 MED ORDER — AZITHROMYCIN 250 MG PO TABS: ORAL_TABLET | ORAL | Status: DC

## 2015-03-01 NOTE — Patient Instructions (Signed)
Please take all new medication as prescribed - the antibiotic  You can also take Delsym OTC for cough, and/or Mucinex (or it's generic off brand) for congestion, and tylenol as needed for pain.  Please continue all other medications as before, and refills have been done if requested.  Please have the pharmacy call with any other refills you may need.  Please keep your appointments with your specialists as you may have planned  Please go to the LAB in the Basement (turn left off the elevator) for the tests to be done at your convenience  You will be contacted by phone if any changes need to be made immediately.  Otherwise, you will receive a letter about your results with an explanation, but please check with MyChart first.  Please remember to sign up for MyChart if you have not done so, as this will be important to you in the future with finding out test results, communicating by private email, and scheduling acute appointments online when needed.

## 2015-03-01 NOTE — Progress Notes (Signed)
   Subjective:    Patient ID: Matthew Glover, male    DOB: 06/26/1986, [age] y.o.   MRN: [id number]  HPI   Here with 2-3 days acute onset sever fever, ST, pressure, headache, general weakness and malaise, and greenish d/c, with occ non prod cough, but pt denies chest pain, wheezing, increased sob or doe, orthopnea, PND, increased LE swelling, palpitations, dizziness or syncope. Past Medical History  Diagnosis Date  . ANXIETY   . ALLERGIC RHINITIS   . PNEUMONIA     bilateral, req bipap 04/2010 hosp  . INSOMNIA   . DEPRESSION     grief rxn after sudden dealth of father 08/2009  . ADD (attention deficit disorder)     dx clarified 08/2011>start adderall   Past Surgical History  Procedure Laterality Date  . Appendectomy  2010    reports that he has never smoked. He does not have any smokeless tobacco history on file. He reports that he drinks alcohol. He reports that he does not use illicit drugs. family history includes Diabetes in his other; Heart disease in his other; Hyperlipidemia in his other; Hypertension in his other. Allergies  Allergen Reactions  . Ibuprofen   . Promethazine Hcl     Pt states   Current Outpatient Prescriptions on File Prior to Visit  Medication Sig Dispense Refill  . ALPRAZolam (XANAX) 0.5 MG tablet Take 1 tablet (0.5 mg total) by mouth 3 (three) times daily as needed for anxiety. 30 tablet 0  . zolpidem (AMBIEN) 5 MG tablet Take 1 tablet (5 mg total) by mouth at bedtime as needed for sleep. 30 tablet 1   No current facility-administered medications on file prior to visit.   Review of Systems All otherwise neg per pt     Objective:   Physical Exam BP 128/88 mmHg  Pulse 72  Temp(Src) 99 F (37.2 C) (Oral)  Resp 18  Ht 5\' 11"  (1.803 m)  Wt 205 lb 1.3 oz (93.024 kg)  BMI 28.62 kg/m2  SpO2 98% VS noted, mild ill Constitutional: Pt appears in no significant distress HENT: Head: NCAT.  Right Ear: External ear normal.  Left Ear: External ear normal.    Eyes: . Pupils are equal, round, and reactive to light. Conjunctivae and EOM are normal Bilat tm's with mild erythema.  Max sinus areas mild tender.  Pharynx with mild erythema, + exudate Neck: Normal range of motion. Neck supple.  Cardiovascular: Normal rate and regular rhythm.   Pulmonary/Chest: Effort normal and breath sounds without rales or wheezing.  Neurological: Pt is alert. Not confused , motor grossly intact Skin: Skin is warm. No rash, no LE edema Psychiatric: Pt behavior is normal. No agitation.     Assessment & Plan:

## 2015-03-01 NOTE — Progress Notes (Signed)
Pre visit review using our clinic review tool, if applicable. No additional management support is needed unless otherwise documented below in the visit note. 

## 2015-03-02 NOTE — Assessment & Plan Note (Signed)
Mild to mod, for antibx course,  to f/u any worsening symptoms or concerns, also for monospot - r/o ebv

## 2016-12-06 ENCOUNTER — Ambulatory Visit (INDEPENDENT_AMBULATORY_CARE_PROVIDER_SITE_OTHER): Payer: Commercial Managed Care - HMO | Admitting: Adult Health

## 2016-12-06 VITALS — BP 112/60 | Temp 97.9°F | Wt 214.8 lb

## 2016-12-06 DIAGNOSIS — J0111 Acute recurrent frontal sinusitis: Secondary | ICD-10-CM | POA: Diagnosis not present

## 2016-12-06 MED ORDER — DOXYCYCLINE HYCLATE 100 MG PO CAPS: 100.0000 mg | ORAL_CAPSULE | Freq: Two times a day (BID) | ORAL | 0 refills | Status: DC

## 2016-12-06 NOTE — Progress Notes (Signed)
Subjective:    Patient ID: Matthew Glover, male    DOB: January 09, 1986, 31 y.o.   MRN: [id number]  Was seen by [facility] and was prescribed Tessalon Pearls and Tamiflu for suspected flu. He does not believe that he has the flu. Reports chronic sinus infection and feels as though this is what it feels like.     Sinusitis  This is a new problem. The current episode started in the past 7 days (8 days ). The problem has been waxing and waning since onset. There has been no fever. Associated symptoms include congestion, coughing, headaches, sinus pressure and a sore throat. Pertinent negatives include no chills, diaphoresis or ear pain.    Review of Systems  Constitutional: Positive for activity change and fatigue. Negative for appetite change, chills, diaphoresis, fever and unexpected weight change.  HENT: Positive for congestion, postnasal drip, rhinorrhea, sinus pain, sinus pressure and sore throat. Negative for ear discharge and ear pain.   Respiratory: Positive for cough and chest tightness. Negative for wheezing.   Cardiovascular: Negative.   Neurological: Positive for headaches.   Past Medical History:  Diagnosis Date  . ADD (attention deficit disorder)    dx clarified 08/2011>start adderall  . ALLERGIC RHINITIS   . ANXIETY   . DEPRESSION    grief rxn after sudden dealth of father 08/2009  . INSOMNIA   . PNEUMONIA    bilateral, req bipap 04/2010 hosp    Social History   Social History  . Marital status: Single    Spouse name: N/A  . Number of children: N/A  . Years of education: N/A   Occupational History  . Not on file.   Social History Main Topics  . Smoking status: Never Smoker  . Smokeless tobacco: Not on file  . Alcohol use 0.0 oz/week     Comment: 2/week  . Drug use: No  . Sexual activity: Not on file   Other Topics Concern  . Not on file   Social History Narrative   Single, has girlfriend. 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 APPENDECTOMY  2010    Family History  Problem Relation Age of Onset  . Diabetes Other     positive in parent & grandparent not sure which 1  . Hyperlipidemia Other   . Heart disease Other   . Hypertension Other     Allergies  Allergen Reactions  . Ibuprofen   . Promethazine Hcl     Pt states    No current outpatient prescriptions on file prior to visit.   No current facility-administered medications on file prior to visit.     BP 112/60 (BP Location: Left Arm, Patient Position: Sitting, Cuff Size: Normal)   Temp 97.9 F (36.6 C) (Oral)   Wt 214 lb 12.8 oz (97.4 kg)   BMI 29.96 kg/m       Objective:   Physical Exam  Constitutional: He is oriented to person, place, and time. He appears well-developed and well-nourished. No distress.  HENT:  Head: Normocephalic and atraumatic.  Right Ear: Hearing, tympanic membrane, external ear and ear canal normal.  Left Ear: Hearing, tympanic membrane and external ear normal.  Nose: Mucosal edema and rhinorrhea present. Right sinus exhibits frontal sinus tenderness. Left sinus exhibits frontal sinus tenderness. Left sinus exhibits no maxillary sinus tenderness.  Mouth/Throat: Uvula is midline, oropharynx is clear and moist and mucous  membranes are normal. No oropharyngeal exudate.  Eyes: Conjunctivae and EOM are normal. Pupils are equal, round, and reactive to light. Right eye exhibits no discharge. Left eye exhibits no discharge.  Neck: Normal range of motion. Neck supple.  Cardiovascular: Normal rate, regular rhythm, normal heart sounds and intact distal pulses.  Exam reveals no gallop and no friction rub.   No murmur heard. Pulmonary/Chest: Effort normal and breath sounds normal. No respiratory distress. He has no wheezes. He has no rales. He exhibits no tenderness.  Lymphadenopathy:    He has no cervical adenopathy.  Neurological: He is alert and  oriented to person, place, and time.  Skin: Skin is warm and dry. No rash noted. He is not diaphoretic. No erythema. No pallor.  Psychiatric: He has a normal mood and affect. His behavior is normal. Judgment and thought content normal.  Nursing note and vitals reviewed.     Assessment & Plan:  1. Acute recurrent frontal sinusitis - doxycycline (VIBRAMYCIN) 100 MG capsule; Take 1 capsule (100 mg total) by mouth 2 (two) times daily.  Dispense: 14 capsule; Refill: 0 - Add Flonase - Follow up if no resolution of symptoms   Shirline Frees, NP

## 2017-01-03 ENCOUNTER — Telehealth: Payer: Self-pay | Admitting: Internal Medicine

## 2017-01-03 NOTE — Telephone Encounter (Signed)
Error

## 2017-01-04 ENCOUNTER — Encounter: Payer: Self-pay | Admitting: Nurse Practitioner

## 2017-01-04 ENCOUNTER — Ambulatory Visit (INDEPENDENT_AMBULATORY_CARE_PROVIDER_SITE_OTHER): Payer: Commercial Managed Care - HMO | Admitting: Nurse Practitioner

## 2017-01-04 VITALS — BP 122/66 | HR 65 | Temp 98.1°F | Ht 71.0 in | Wt 219.0 lb

## 2017-01-04 DIAGNOSIS — J309 Allergic rhinitis, unspecified: Secondary | ICD-10-CM | POA: Diagnosis not present

## 2017-01-04 DIAGNOSIS — J0140 Acute pansinusitis, unspecified: Secondary | ICD-10-CM | POA: Diagnosis not present

## 2017-01-04 MED ORDER — FLUTICASONE PROPIONATE 50 MCG/ACT NA SUSP: 2.0000 | Freq: Every day | NASAL | 0 refills | Status: DC

## 2017-01-04 MED ORDER — DM-GUAIFENESIN ER 30-600 MG PO TB12: 1.0000 | ORAL_TABLET | Freq: Two times a day (BID) | ORAL | 0 refills | Status: DC | PRN

## 2017-01-04 MED ORDER — CEFDINIR 300 MG PO CAPS: 300.0000 mg | ORAL_CAPSULE | Freq: Two times a day (BID) | ORAL | 0 refills | Status: DC

## 2017-01-04 MED ORDER — METHYLPREDNISOLONE ACETATE 40 MG/ML IJ SUSP
40.0000 mg | Freq: Once | INTRAMUSCULAR | Status: AC
Start: 2017-01-04 — End: 2017-01-04
  Administered 2017-01-04: 40 mg via INTRAMUSCULAR

## 2017-01-04 MED ORDER — SALINE SPRAY 0.65 % NA SOLN: 1.0000 | NASAL | 0 refills | Status: DC | PRN

## 2017-01-04 NOTE — Progress Notes (Signed)
Subjective:  Patient ID: Matthew Glover, male    DOB: 26-Jun-1986  Age: 31 y.o. MRN: [id number]  CC: Cough (coughing,congestion, sinus pressure for 3 wks. got abx--not better)   Sinusitis  This is a recurrent problem. The current episode started 1 to 4 weeks ago. The problem has been waxing and waning since onset. There has been no fever. Associated symptoms include congestion, coughing, headaches, sinus pressure and a sore throat. Pertinent negatives include no chills, diaphoresis, ear pain, hoarse voice, neck pain, shortness of breath, sneezing or swollen glands. Past treatments include saline sprays, acetaminophen and antibiotics. The treatment provided mild relief.    Outpatient Medications Prior to Visit  Medication Sig Dispense Refill  . doxycycline (VIBRAMYCIN) 100 MG capsule Take 1 capsule (100 mg total) by mouth 2 (two) times daily. (Patient not taking: Reported on 01/04/2017) 14 capsule 0   No facility-administered medications prior to visit.     ROS See HPI  Objective:  BP 122/66   Pulse 65   Temp 98.1 F (36.7 C)   Ht 5\' 11"  (1.803 m)   Wt 219 lb (99.3 kg)   SpO2 98%   BMI 30.54 kg/m   BP Readings from Last 3 Encounters:  01/04/17 122/66  12/06/16 112/60  03/01/15 128/88    Wt Readings from Last 3 Encounters:  01/04/17 219 lb (99.3 kg)  12/06/16 214 lb 12.8 oz (97.4 kg)  03/01/15 205 lb 1.3 oz (93 kg)    Physical Exam  Constitutional: He is oriented to person, place, and time. No distress.  HENT:  Right Ear: Tympanic membrane, external ear and ear canal normal.  Left Ear: Tympanic membrane and ear canal normal.  Nose: Mucosal edema and rhinorrhea present. Right sinus exhibits maxillary sinus tenderness and frontal sinus tenderness. Left sinus exhibits maxillary sinus tenderness and frontal sinus tenderness.  Mouth/Throat: Uvula is midline. Posterior oropharyngeal erythema present. No oropharyngeal exudate.  Eyes: No scleral icterus.  Neck: Normal range  of motion. Neck supple.  Cardiovascular: Normal rate and regular rhythm.   Pulmonary/Chest: Effort normal and breath sounds normal.  Lymphadenopathy:    He has no cervical adenopathy.  Neurological: He is alert and oriented to person, place, and time.  Vitals reviewed.   Lab Results  Component Value Date   WBC 5.2 09/28/2010   HGB 14.9 09/28/2010   HCT 42.7 09/28/2010   PLT 203.0 09/28/2010   GLUCOSE 87 09/28/2010   CHOL 198 09/28/2010   TRIG 93.0 09/28/2010   HDL 37.70 (L) 09/28/2010   LDLCALC 142 (H) 09/28/2010   ALT 25 09/28/2010   AST 23 09/28/2010   NA 137 09/28/2010   K 4.1 09/28/2010   CL 99 09/28/2010   CREATININE 0.8 09/28/2010   BUN 14 09/28/2010   CO2 29 09/28/2010   TSH 1.98 09/28/2010    Dg Foot 2 Views Right  Result Date: 05/27/2013 *RADIOLOGY REPORT* Clinical Data: Stepped on a nail 1 week ago, pain ball of foot under third metatarsal, rule out foreign body also the dorsal mid right foot pain RIGHT FOOT - 2 VIEW Comparison: None. Findings: No fracture, dislocation, or foreign body. IMPRESSION: Negative Original Report Authenticated By: Esperanza Heir, M.D.    Assessment & Plan:   Matthew Glover was seen today for cough.  Diagnoses and all orders for this visit:  Acute non-recurrent pansinusitis -     dextromethorphan-guaiFENesin (MUCINEX DM) 30-600 MG 12hr tablet; Take 1 tablet by mouth 2 (two) times daily as needed for cough. -  cefdinir (OMNICEF) 300 MG capsule; Take 1 capsule (300 mg total) by mouth 2 (two) times daily. -     methylPREDNISolone acetate (DEPO-MEDROL) injection 40 mg; Inject 1 mL (40 mg total) into the muscle once.  Allergic rhinitis, unspecified chronicity, unspecified seasonality, unspecified trigger -     fluticasone (FLONASE) 50 MCG/ACT nasal spray; Place 2 sprays into both nostrils daily. -     sodium chloride (OCEAN) 0.65 % SOLN nasal spray; Place 1 spray into both nostrils as needed for congestion.   I have discontinued Mr.  Matthew Glover's doxycycline. I am also having him start on fluticasone, sodium chloride, dextromethorphan-guaiFENesin, and cefdinir. We will continue to administer methylPREDNISolone acetate.  Meds ordered this encounter  Medications  . fluticasone (FLONASE) 50 MCG/ACT nasal spray    Sig: Place 2 sprays into both nostrils daily.    Dispense:  16 g    Refill:  0    Order Specific Question:   Supervising Provider    Answer:   Tresa GarterPLOTNIKOV, ALEKSEI V [[id number]]  . sodium chloride (OCEAN) 0.65 % SOLN nasal spray    Sig: Place 1 spray into both nostrils as needed for congestion.    Dispense:  15 mL    Refill:  0    Order Specific Question:   Supervising Provider    Answer:   Tresa GarterPLOTNIKOV, ALEKSEI V [[id number]]  . dextromethorphan-guaiFENesin (MUCINEX DM) 30-600 MG 12hr tablet    Sig: Take 1 tablet by mouth 2 (two) times daily as needed for cough.    Dispense:  14 tablet    Refill:  0    Order Specific Question:   Supervising Provider    Answer:   Tresa GarterPLOTNIKOV, ALEKSEI V [[id number]]  . cefdinir (OMNICEF) 300 MG capsule    Sig: Take 1 capsule (300 mg total) by mouth 2 (two) times daily.    Dispense:  14 capsule    Refill:  0    Order Specific Question:   Supervising Provider    Answer:   Tresa GarterPLOTNIKOV, ALEKSEI V [[id number]]  . methylPREDNISolone acetate (DEPO-MEDROL) injection 40 mg    Follow-up: Return if symptoms worsen or fail to improve.  Alysia Pennaharlotte Felica Chargois, NP

## 2017-01-04 NOTE — Patient Instructions (Addendum)
Encourage adequate oral hydration.  Need to be evaluated by ENT if no improvement in 2weeks.

## 2017-01-04 NOTE — Progress Notes (Signed)
Pre visit review using our clinic review tool, if applicable. No additional management support is needed unless otherwise documented below in the visit note. 

## 2017-05-28 ENCOUNTER — Encounter: Payer: Self-pay | Admitting: Family Medicine

## 2017-05-28 ENCOUNTER — Ambulatory Visit (INDEPENDENT_AMBULATORY_CARE_PROVIDER_SITE_OTHER): Payer: 59 | Admitting: Family Medicine

## 2017-05-28 VITALS — BP 124/72 | HR 55 | Temp 97.8°F | Ht 71.0 in | Wt 205.0 lb

## 2017-05-28 DIAGNOSIS — L237 Allergic contact dermatitis due to plants, except food: Secondary | ICD-10-CM

## 2017-05-28 MED ORDER — PREDNISONE 20 MG PO TABS: ORAL_TABLET | ORAL | 0 refills | Status: DC

## 2017-05-28 NOTE — Assessment & Plan Note (Signed)
Has an rash associated with being outside and is similar to previous times.  - prednisone - informed of other measures.  - Given indications to return.

## 2017-05-28 NOTE — Patient Instructions (Signed)
Thank you for coming in,   You can take an antihistamine to help with the itching. He can use Benadryl at night if you're having itching and sleep. He also use calamine lotion over the areas affected. Please follow-up if your symptoms improved.   Please feel free to call with any questions or concerns at any time, at [phone]. --Dr. Jordan LikesSchmitz

## 2017-05-28 NOTE — Progress Notes (Signed)
Matthew Glover - 31 y.o. male MRN [id number]  Date of birth: 1985-12-13  SUBJECTIVE:  Including CC & ROS.  Chief Complaint  Patient presents with  . Poison Ivy    legs x 3-4 day   Mr. Matthew HumphreyBurnsideIs a 31 year old male that is presenting with a rash on his lower legs. He was hanging a deer stand recently and started having the rash after this. It is occurring on his lower extremities. It is pruritic in nature. He has taken a bleach bath with no improvement. He has had poison oak before and this feels similar. He denies any fevers or chills. He is not taking any medications.     Review of Systems  Constitutional: Negative for chills and fever.  Skin: Positive for rash.   otherwise negative  HISTORY: Past Medical, Surgical, Social, and Family History Reviewed & Updated per EMR.   Pertinent Historical Findings include:  Past Medical History:  Diagnosis Date  . ADD (attention deficit disorder)    dx clarified 08/2011>start adderall  . ALLERGIC RHINITIS   . ANXIETY   . DEPRESSION    grief rxn after sudden dealth of father 08/2009  . INSOMNIA   . PNEUMONIA    bilateral, req bipap 04/2010 hosp    Past Surgical History:  Procedure Laterality Date  . APPENDECTOMY  2010    Allergies  Allergen Reactions  . Ibuprofen   . Promethazine Hcl     Pt states    Family History  Problem Relation Age of Onset  . Diabetes Other        positive in parent & grandparent not sure which 1  . Hyperlipidemia Other   . Heart disease Other   . Hypertension Other      Social History   Social History  . Marital status: Single    Spouse name: N/A  . Number of children: N/A  . Years of education: N/A   Occupational History  . Not on file.   Social History Main Topics  . Smoking status: Never Smoker  . Smokeless tobacco: Never Used  . Alcohol use 0.0 oz/week     Comment: 2/week  . Drug use: No  . Sexual activity: Not on file   Other Topics Concern  . Not on file   Social History  Narrative   Single, has girlfriend. 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 He appears well-developed and well-nourished.  HENT:  Head: Normocephalic and atraumatic.  Right Ear: External ear normal.  Left Ear: External ear normal.  Eyes: Conjunctivae and EOM are normal.  Neck: Normal range of motion. Neck supple.  Pulmonary/Chest: Effort normal.  Neurological: He is alert and oriented to person, place, and time.  Skin: Skin is warm. Rash noted.  Linear vesicles occurring on his lower extremities with no active draining.  Psychiatric: He has a normal mood and affect. His behavior is normal.    ASSESSMENT & PLAN:   Poison ivy Has an rash associated with being outside and is similar to previous times.  - prednisone - informed of  other measures.  - Given indications to return.

## 2017-06-02 ENCOUNTER — Encounter (HOSPITAL_BASED_OUTPATIENT_CLINIC_OR_DEPARTMENT_OTHER): Payer: Self-pay | Admitting: Emergency Medicine

## 2017-06-02 ENCOUNTER — Emergency Department (HOSPITAL_BASED_OUTPATIENT_CLINIC_OR_DEPARTMENT_OTHER)
Admission: EM | Admit: 2017-06-02 | Discharge: 2017-06-02 | Disposition: A | Discharge status: Home / Self Care | Payer: Worker's Compensation | Attending: Emergency Medicine | Admitting: Emergency Medicine

## 2017-06-02 DIAGNOSIS — Y939 Activity, unspecified: Secondary | ICD-10-CM | POA: Diagnosis not present

## 2017-06-02 DIAGNOSIS — Z79899 Other long term (current) drug therapy: Secondary | ICD-10-CM | POA: Insufficient documentation

## 2017-06-02 DIAGNOSIS — S6992XA Unspecified injury of left wrist, hand and finger(s), initial encounter: Secondary | ICD-10-CM | POA: Diagnosis present

## 2017-06-02 DIAGNOSIS — Y929 Unspecified place or not applicable: Secondary | ICD-10-CM | POA: Insufficient documentation

## 2017-06-02 DIAGNOSIS — W25XXXA Contact with sharp glass, initial encounter: Secondary | ICD-10-CM | POA: Diagnosis not present

## 2017-06-02 DIAGNOSIS — F909 Attention-deficit hyperactivity disorder, unspecified type: Secondary | ICD-10-CM | POA: Diagnosis not present

## 2017-06-02 DIAGNOSIS — S61213A Laceration without foreign body of left middle finger without damage to nail, initial encounter: Secondary | ICD-10-CM | POA: Diagnosis not present

## 2017-06-02 DIAGNOSIS — Y990 Civilian activity done for income or pay: Secondary | ICD-10-CM | POA: Insufficient documentation

## 2017-06-02 MED ORDER — LIDOCAINE HCL 1 % IJ SOLN
INTRAMUSCULAR | Status: AC
  Administered 2017-06-02: 10 mL
  Filled 2017-06-02: qty 10

## 2017-06-02 MED ORDER — LIDOCAINE HCL (PF) 1 % IJ SOLN: 10.0000 mL | Freq: Once | INTRAMUSCULAR | Status: DC

## 2017-06-02 MED ORDER — LIDOCAINE HCL 1 % IJ SOLN
10.0000 mL | Freq: Once | INTRAMUSCULAR | Status: AC
  Administered 2017-06-02: 10 mL

## 2017-06-02 NOTE — ED Triage Notes (Addendum)
Pt reports cutting left middle finger on glass at 12 midnight, swelling and redness noted.  Laceration appx 1inch, no bleeding at this time.

## 2017-06-02 NOTE — ED Notes (Signed)
Pt made aware to return if symptoms worsen or if any life threatening symptoms occur.   

## 2017-06-02 NOTE — ED Provider Notes (Signed)
[facility]   CSN: [id number] Arrival date & time: 06/02/17  0902     History   Chief Complaint Chief Complaint  Patient presents with  . Hand Injury    HPI Matthew Glover is a 31 y.o. male who presents with laceration to left third digit. Patient is a IT sales professionalfirefighter and reports hitting his hand on a piece of glass around midnight last night. The laceration is over his knuckle and keeps opening and bleeding when he moves his finger. Patient denies any pain, numbness, or tingling. He is applied and Band-Aid wash the wound at home. His tetanus is up-to-date.  HPI  Past Medical History:  Diagnosis Date  . ADD (attention deficit disorder)    dx clarified 08/2011>start adderall  . ALLERGIC RHINITIS   . ANXIETY   . DEPRESSION    grief rxn after sudden dealth of father 08/2009  . INSOMNIA   . PNEUMONIA    bilateral, req bipap 04/2010 hosp    Patient Active Problem List   Diagnosis Date Noted  . Poison ivy 05/28/2017  . Acute pharyngitis 03/01/2015  . Psoriasis 12/25/2011  . ADD (attention deficit disorder)   . Impetigo 01/22/2011  . DEPRESSION 05/26/2010  . Allergic rhinitis 05/26/2010  . Anxiety state 05/23/2010  . Insomnia 05/23/2010    Past Surgical History:  Procedure Laterality Date  . APPENDECTOMY  2010       Home Medications    Prior to Admission medications   Medication Sig Start Date End Date Taking? Authorizing Provider  cefdinir (OMNICEF) 300 MG capsule Take 1 capsule (300 mg total) by mouth 2 (two) times daily. 01/04/17   Nche, Bonna Gainsharlotte Lum, NP  dextromethorphan-guaiFENesin (MUCINEX DM) 30-600 MG 12hr tablet Take 1 tablet by mouth 2 (two) times daily as needed for cough. 01/04/17   Nche, Bonna Gainsharlotte Lum, NP  fluticasone (FLONASE) 50 MCG/ACT nasal spray Place 2 sprays into both nostrils daily. 01/04/17   Nche, Bonna Gainsharlotte Lum, NP  predniSONE (DELTASONE) 20 MG tablet TAKE 3 TABS ONCE DAILY FOR 3 DAYS, 2 FOR 3 DAYS, 1 FOR 2 DAYS THEN 1/2  FOR 2 DAYS 05/28/17   Myra RudeSchmitz, Jeremy E, MD  sodium chloride (OCEAN) 0.65 % SOLN nasal spray Place 1 spray into both nostrils as needed for congestion. 01/04/17   Nche, Bonna Gainsharlotte Lum, NP    Family History Family History  Problem Relation Age of Onset  . Diabetes Other        positive in parent & grandparent not sure which 1  . Hyperlipidemia Other   . Heart disease Other   . Hypertension Other     Social History Social History  Substance Use Topics  . Smoking status: Never Smoker  . Smokeless tobacco: Never Used  . Alcohol use 0.0 oz/week     Comment: 2/week     Allergies   Ibuprofen and Promethazine hcl   Review of Systems Review of Systems  Constitutional: Negative for fever.  Musculoskeletal: Negative for arthralgias and joint swelling.  Skin: Positive for wound.  Neurological: Negative for numbness.     Physical Exam Updated Vital Signs BP 124/73 (BP Location: Right Arm)   Pulse 62   Temp 97.9 F (36.6 C) (Oral)   Resp 16   Ht 5\' 11"  (1.803 m)   Wt 93 kg (205 lb)   SpO2 98%   BMI 28.59 kg/m   Physical Exam  Constitutional: He appears well-developed and well-nourished. No distress.  HENT:  Head: Normocephalic and  atraumatic.  Mouth/Throat: Oropharynx is clear and moist. No oropharyngeal exudate.  Eyes: Pupils are equal, round, and reactive to light. Conjunctivae are normal. Right eye exhibits no discharge. Left eye exhibits no discharge. No scleral icterus.  Neck: Normal range of motion. Neck supple. No thyromegaly present.  Cardiovascular: Normal rate, regular rhythm, normal heart sounds and intact distal pulses.  Exam reveals no gallop and no friction rub.   No murmur heard. Pulmonary/Chest: Effort normal and breath sounds normal. No stridor. No respiratory distress. He has no wheezes. He has no rales.  Musculoskeletal: He exhibits no edema.  Lymphadenopathy:    He has no cervical adenopathy.  Neurological: He is alert. Coordination normal.  Skin:  Skin is warm and dry. No rash noted. He is not diaphoretic. No pallor.  ~1cm laceration flap over dorsal PIP of third digit; full range of motion opens laceration, mild active bleeding; normal sensation; cap refill < 2 secs  Psychiatric: He has a normal mood and affect.  Nursing note and vitals reviewed.    ED Treatments / Results  Labs (all labs ordered are listed, but only abnormal results are displayed) Labs Reviewed - No data to display  EKG  EKG Interpretation None       Radiology No results found.  Procedures Procedures (including critical care time)  LACERATION REPAIR Performed by: Emi HolesAlexandra M Seth Friedlander Authorized by: Emi HolesAlexandra M Josslynn Mentzer Consent: Verbal consent obtained. Risks and benefits: risks, benefits and alternatives were discussed Consent given by: patient Patient identity confirmed: provided demographic data Prepped and Draped in normal sterile fashion Wound explored  Laceration Location: left 3rd digit dorsal PIP  Laceration Length: 1cm  No Foreign Bodies seen or palpated  Anesthesia: digital block  Local anesthetic: lidocaine 1% w/o epinephrine  Anesthetic total: 4 ml  Irrigation method: syringe Amount of cleaning: standard  Skin closure: 5-0 Prolene  Number of sutures: 3  Technique: Simple interrupted   Patient tolerance: Patient tolerated the procedure well with no immediate complications.   Medications Ordered in ED Medications  lidocaine (XYLOCAINE) 1 % (with pres) injection 10 mL (10 mLs Infiltration Given by Other 06/02/17 1046)     Initial Impression / Assessment and Plan / ED Course  I have reviewed the triage vital signs and the nursing notes.  Pertinent labs & imaging results that were available during my care of the patient were reviewed by me and considered in my medical decision making (see chart for details).     Tetanus UTD. Laceration occurred < 12 hours prior to repair. Patient placed in static finger splint to hold sutures  in place. Discussed laceration care with pt and answered questions. Pt to f-u for suture removal in 10 days and wound check sooner should there be signs of dehiscence or infection. Pt is hemodynamically stable with no complaints prior to dc.  Patient understands and agrees with plan.   Final Clinical Impressions(s) / ED Diagnoses   Final diagnoses:  Laceration of left middle finger without foreign body without damage to nail, initial encounter    New Prescriptions Discharge Medication List as of 06/02/2017 11:11 AM       Emi HolesLaw, Allie Ousley M, PA-C 06/02/17 1655    Geoffery Lyonselo, Douglas, MD 06/03/17 2306

## 2017-06-02 NOTE — Discharge Instructions (Signed)
Treatment: Keep your wound dry and dressing applied until this time tomorrow. After 24 hours, you may wash with warm soapy water. Dry and apply antibiotic ointment and clean dressing. Do this daily until your sutures are removed. Wear your splint whenever you be moving your fingers a lot.  Follow-up: Please follow-up with your primary care provider or return to emergency department in 7-10 days for suture removal. Be aware of signs of infection: fever, increasing pain, redness, swelling, drainage from the area. Please call your primary care provider or return to emergency department if you develop any of these symptoms or if any of the sutures come out prior to removal. Please return to the emergency department if you develop any other new or worsening symptoms.

## 2018-01-06 ENCOUNTER — Encounter: Payer: Self-pay | Admitting: Family Medicine

## 2018-01-06 ENCOUNTER — Ambulatory Visit: Payer: 59 | Admitting: Family Medicine

## 2018-01-06 VITALS — BP 126/70 | HR 61 | Temp 98.1°F | Wt 218.0 lb

## 2018-01-06 DIAGNOSIS — J069 Acute upper respiratory infection, unspecified: Secondary | ICD-10-CM

## 2018-01-06 MED ORDER — AZITHROMYCIN 250 MG PO TABS: ORAL_TABLET | ORAL | 0 refills | Status: DC

## 2018-01-06 NOTE — Patient Instructions (Signed)
Please try things such as zyrtec-D or allegra-D which is an antihistamine and decongestant.   Please try afrin which will help with nasal congestion but use for only three days.   Please also try using a netti pot on a regular occasion.  Honey can help with a sore throat.   Vick's and delsym can help with a cough.

## 2018-01-06 NOTE — Assessment & Plan Note (Signed)
Possible for viral in nature.  - counseled on supportive care  - printed azithromycin to have on hand.  - f/u PRN

## 2018-01-06 NOTE — Progress Notes (Signed)
Matthew DoKevin R Bou - 32 y.o. male MRN [id number]  Date of birth: 11-23-1985  SUBJECTIVE:  Including CC & ROS.  Chief Complaint  Patient presents with  . Sinus Problem    sinus pressure, headaches, vomiting, nasal congestion, sx started a week ago     Matthew Glover is a 32 y.o. male that is presenting with sinus congestion. Symptoms have been occurring for a couple of weeks. He felt like he had improvement of his symptoms and then regressed. He has not had any fevers or to [place]. He has not had any improvement with over-the-counter medications. 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 Denies any body aches or chills.    Review of Systems  Constitutional: Negative for fever.  HENT: Positive for congestion and sinus pain.   Respiratory: Negative for cough.   Cardiovascular: Negative for chest pain.  Gastrointestinal: Negative for abdominal pain.  Musculoskeletal: Negative for back pain.    HISTORY: Past Medical, Surgical, Social, and Family History Reviewed & Updated per EMR.   Pertinent Historical Findings include:  Past Medical History:  Diagnosis Date  . ADD (attention deficit disorder)    dx clarified 08/2011>start adderall  . ALLERGIC RHINITIS   . ANXIETY   . DEPRESSION    grief rxn after sudden dealth of father 08/2009  . INSOMNIA   . PNEUMONIA    bilateral, req bipap 04/2010 hosp    Past Surgical History:  Procedure Laterality Date  . APPENDECTOMY  2010    Allergies  Allergen Reactions  . Ibuprofen   . Promethazine Hcl     Pt states    Family History  Problem Relation Age of Onset  . Diabetes Other        positive in parent & grandparent not sure which 1  . Hyperlipidemia Other   . Heart disease Other   . Hypertension Other      Social History   Socioeconomic History  . Marital status: Single    Spouse name: Not on file  . Number of children: Not on file  . Years of education: Not on file  . Highest education  level: Not on file  Social Needs  . Financial resource strain: Not on file  . Food insecurity - worry: Not on file  . Food insecurity - inability: Not on file  . Transportation needs - medical: Not on file  . Transportation needs - non-medical: Not on file  Occupational History  . Not on file  Tobacco Use  . Smoking status: Never Smoker  . Smokeless tobacco: Never Used  Substance and Sexual Activity  . Alcohol use: Yes    Alcohol/week: 0.0 oz    Comment: 2/week  . Drug use: No  . Sexual activity: Not on file  Other Topics Concern  . Not on file  Social History Narrative   Single, has girlfriend. 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  Eye: normal EOM, normal conjunctiva and  lids CV:  no edema, +2 pedal pulses, regular rate and rhythm, S1-S2   Resp: no accessory muscle use, non-labored, clear to auscultation bilaterally, no crackles or wheezes  Skin: no rashes, no areas of induration  Neuro: normal tone, normal sensation to touch Psych:  normal insight, alert and oriented MSK: Normal gait, normal strength       ASSESSMENT & PLAN:   Upper respiratory tract infection Possible for viral in nature.  - counseled on supportive care  - printed azithromycin to have on hand.  - f/u PRN

## 2018-01-20 ENCOUNTER — Encounter: Payer: Self-pay | Admitting: Family Medicine

## 2018-01-20 ENCOUNTER — Ambulatory Visit: Payer: Self-pay | Admitting: Family

## 2018-01-20 ENCOUNTER — Ambulatory Visit: Payer: 59 | Admitting: Family Medicine

## 2018-01-20 VITALS — BP 128/78 | HR 70 | Temp 98.8°F | Ht 71.0 in | Wt 220.2 lb

## 2018-01-20 DIAGNOSIS — B349 Viral infection, unspecified: Secondary | ICD-10-CM | POA: Diagnosis not present

## 2018-01-20 LAB — POC INFLUENZA A&B (BINAX/QUICKVUE)
INFLUENZA B, POC: NEGATIVE
Influenza A, POC: NEGATIVE

## 2018-01-20 MED ORDER — ONDANSETRON HCL 4 MG PO TABS: 4.0000 mg | ORAL_TABLET | Freq: Three times a day (TID) | ORAL | 0 refills | Status: DC | PRN

## 2018-01-20 NOTE — Patient Instructions (Signed)
Viral Gastroenteritis, Adult  Viral gastroenteritis is also known as the stomach flu. This condition is caused by various viruses. These viruses can be passed from person to person very easily (are very contagious). This condition may affect your stomach, small intestine, and large intestine. It can cause sudden watery diarrhea, fever, and vomiting.  Diarrhea and vomiting can make you feel weak and cause you to become dehydrated. You may not be able to keep fluids down. Dehydration can make you tired and thirsty, cause you to have a dry mouth, and decrease how often you urinate. Older adults and people with other diseases or a weak immune system are at higher risk for dehydration.  It is important to replace the fluids that you lose from diarrhea and vomiting. If you become severely dehydrated, you may need to get fluids through an IV tube.  What are the causes?  Gastroenteritis is caused by various viruses, including rotavirus and norovirus. Norovirus is the most common cause in adults.  You can get sick by eating food, drinking water, or touching a surface contaminated with one of these viruses. You can also get sick from sharing utensils or other personal items with an infected person.  What increases the risk?  This condition is more likely to develop in people:  · Who have a weak defense system (immune system).  · Who live with one or more children who are younger than 2 years old.  · Who live in a nursing home.  · Who go on cruise ships.    What are the signs or symptoms?  Symptoms of this condition start suddenly 1-2 days after exposure to a virus. Symptoms may last a few days or as long as a week. The most common symptoms are watery diarrhea and vomiting. Other symptoms include:  · Fever.  · Headache.  · Fatigue.  · Pain in the abdomen.  · Chills.  · Weakness.  · Nausea.  · Muscle aches.  · Loss of appetite.    How is this diagnosed?  This condition is diagnosed with a medical history and physical exam. You  may also have a stool test to check for viruses or other infections.  How is this treated?  This condition typically goes away on its own. The focus of treatment is to restore lost fluids (rehydration). Your health care provider may recommend that you take an oral rehydration solution (ORS) to replace important salts and minerals (electrolytes) in your body. Severe cases of this condition may require giving fluids through an IV tube.  Treatment may also include medicine to help with your symptoms.  Follow these instructions at home:  Follow instructions from your health care provider about how to care for yourself at home.  Eating and drinking  Follow these recommendations as told by your health care provider:  · Take an ORS. This is a drink that is sold at pharmacies and retail stores.  · Drink clear fluids in small amounts as you are able. Clear fluids include water, ice chips, diluted fruit juice, and low-calorie sports drinks.  · Eat bland, easy-to-digest foods in small amounts as you are able. These foods include bananas, applesauce, rice, lean meats, toast, and crackers.  · Avoid fluids that contain a lot of sugar or caffeine, such as energy drinks, sports drinks, and soda.  · Avoid alcohol.  · Avoid spicy or fatty foods.    General instructions    · Drink enough fluid to keep your urine clear or   pale yellow.  · Wash your hands often. If soap and water are not available, use hand sanitizer.  · Make sure that all people in your household wash their hands well and often.  · Take over-the-counter and prescription medicines only as told by your health care provider.  · Rest at home while you recover.  · Watch your condition for any changes.  · Take a warm bath to relieve any burning or pain from frequent diarrhea episodes.  · Keep all follow-up visits as told by your health care provider. This is important.  Contact a health care provider if:  · You cannot keep fluids down.  · Your symptoms get worse.  · You have  new symptoms.  · You feel light-headed or dizzy.  · You have muscle cramps.  Get help right away if:  · You have chest pain.  · You feel extremely weak or you faint.  · You see blood in your vomit.  · Your vomit looks like coffee grounds.  · You have bloody or black stools or stools that look like tar.  · You have a severe headache, a stiff neck, or both.  · You have a rash.  · You have severe pain, cramping, or bloating in your abdomen.  · You have trouble breathing or you are breathing very quickly.  · Your heart is beating very quickly.  · Your skin feels cold and clammy.  · You feel confused.  · You have pain when you urinate.  · You have signs of dehydration, such as:  ? Dark urine, very little urine, or no urine.  ? Cracked lips.  ? Dry mouth.  ? Sunken eyes.  ? Sleepiness.  ? Weakness.  This information is not intended to replace advice given to you by your health care provider. Make sure you discuss any questions you have with your health care provider.  Document Released: 10/15/2005 Document Revised: 03/28/2016 Document Reviewed: 06/21/2015  Elsevier Interactive Patient Education © 2018 [employer]

## 2018-01-20 NOTE — Progress Notes (Signed)
Subjective:  Patient ID: Matthew DoKevin R Minchey, male    DOB: Mar 15, 1986  Age: 32 y.o. MRN: [id number]  CC: Chills   HPI Matthew Glover presents for evaluation of a 1-2-day history of headache, nausea, abdominal cramping and watery diarrhea.  There has been fever and myalgia.  Denies stuffy nose drainage sore throat or cough.  He finished up a Z-Pak about a week ago for sinus infection.  He has taken this medicine in the past without issue. Allergic to promethazine. Has not tried ginger ale.   Outpatient Medications Prior to Visit  Medication Sig Dispense Refill  . azithromycin (ZITHROMAX) 250 MG tablet Take 500 mg the first day then 250 mg the next 4 days. Total of 5 days. 6 tablet 0   No facility-administered medications prior to visit.     ROS Review of Systems  Constitutional: Positive for chills, fatigue and fever.  HENT: Negative.   Eyes: Negative for photophobia and visual disturbance.  Respiratory: Negative for cough.   Cardiovascular: Negative.   Gastrointestinal: Positive for abdominal pain and nausea. Negative for abdominal distention, blood in stool, diarrhea and rectal pain.  Musculoskeletal: Positive for myalgias. Negative for arthralgias.  Skin: Negative for rash.  Allergic/Immunologic: Negative for immunocompromised state.  Neurological: Positive for headaches. Negative for weakness.  Hematological: Does not bruise/bleed easily.  Psychiatric/Behavioral: Negative.     Objective:  BP 128/78 (BP Location: Left Arm, Patient Position: Sitting, Cuff Size: Normal)   Pulse 70   Temp 98.8 F (37.1 C) (Oral)   Ht 5\' 11"  (1.803 m)   Wt 220 lb 4 oz (99.9 kg)   SpO2 97%   BMI 30.72 kg/m   BP Readings from Last 3 Encounters:  01/20/18 128/78  01/06/18 126/70  06/02/17 124/73    Wt Readings from Last 3 Encounters:  01/20/18 220 lb 4 oz (99.9 kg)  01/06/18 218 lb (98.9 kg)  06/02/17 205 lb (93 kg)    Physical Exam  Constitutional: He is oriented to person, place,  and time. He appears well-developed and well-nourished. No distress.  HENT:  Head: Normocephalic and atraumatic.  Right Ear: External ear normal.  Left Ear: External ear normal.  Mouth/Throat: Mucous membranes are dry. No oropharyngeal exudate, posterior oropharyngeal edema, posterior oropharyngeal erythema or tonsillar abscesses.  Eyes: Pupils are equal, round, and reactive to light. Right eye exhibits no discharge. Left eye exhibits no discharge. No scleral icterus.  Neck: Neck supple. No JVD present. No tracheal deviation present. No thyromegaly present.  Cardiovascular: Normal rate, regular rhythm and normal heart sounds.  Pulmonary/Chest: Effort normal and breath sounds normal. No stridor.  Abdominal: Bowel sounds are normal. He exhibits no distension. There is no tenderness. There is no rebound and no guarding.  Lymphadenopathy:    He has no cervical adenopathy.  Neurological: He is alert and oriented to person, place, and time.  Skin: Skin is warm and dry. He is not diaphoretic.  Psychiatric: He has a normal mood and affect. His behavior is normal.    Lab Results  Component Value Date   WBC 5.2 09/28/2010   HGB 14.9 09/28/2010   HCT 42.7 09/28/2010   PLT 203.0 09/28/2010   GLUCOSE 87 09/28/2010   CHOL 198 09/28/2010   TRIG 93.0 09/28/2010   HDL 37.70 (L) 09/28/2010   LDLCALC 142 (H) 09/28/2010   ALT 25 09/28/2010   AST 23 09/28/2010   NA 137 09/28/2010   K 4.1 09/28/2010   CL 99 09/28/2010  CREATININE 0.8 09/28/2010   BUN 14 09/28/2010   CO2 29 09/28/2010   TSH 1.98 09/28/2010    No results found.  Assessment & Plan:   Jsean was seen today for chills.  Diagnoses and all orders for this visit:  Viral syndrome -     POC Influenza A&B(BINAX/QUICKVUE) -     ondansetron (ZOFRAN) 4 MG tablet; Take 1 tablet (4 mg total) by mouth every 8 (eight) hours as needed for nausea or vomiting.   I have discontinued Teena Irani. Tirrell's azithromycin. I am also having him  start on ondansetron.  Meds ordered this encounter  Medications  . ondansetron (ZOFRAN) 4 MG tablet    Sig: Take 1 tablet (4 mg total) by mouth every 8 (eight) hours as needed for nausea or vomiting.    Dispense:  20 tablet    Refill:  0    Will go home to rest and use zofran as needed.  Stressed the importance of hydration.  Encouraged use of ginger ale.  RTC if not resolved in a few days.  Out of work through tomorrow.  Follow-up: Return in about 3 days (around 01/23/2018), or if symptoms worsen or fail to improve.  Mliss Sax, MD

## 2018-04-17 ENCOUNTER — Ambulatory Visit: Payer: 59 | Admitting: Family Medicine

## 2018-04-17 ENCOUNTER — Encounter: Payer: Self-pay | Admitting: Family Medicine

## 2018-04-17 VITALS — BP 136/80 | HR 60 | Temp 98.1°F | Ht 71.0 in | Wt 215.2 lb

## 2018-04-17 DIAGNOSIS — S70362A Insect bite (nonvenomous), left thigh, initial encounter: Secondary | ICD-10-CM

## 2018-04-17 DIAGNOSIS — W57XXXA Bitten or stung by nonvenomous insect and other nonvenomous arthropods, initial encounter: Secondary | ICD-10-CM | POA: Diagnosis not present

## 2018-04-17 MED ORDER — DOXYCYCLINE HYCLATE 100 MG PO TABS: 100.0000 mg | ORAL_TABLET | Freq: Two times a day (BID) | ORAL | 0 refills | Status: DC

## 2018-04-17 NOTE — Progress Notes (Signed)
Subjective:  Patient ID: Matthew Glover, male    DOB: Jul 01, 1986  Age: 32 y.o. MRN: [id number]  CC: Fever   HPI Matthew Glover presents for patient has been having subjective fevers, headaches, myalgias and arthralgias with malaise status post multiple tick bites a couple of weeks ago.  1 of the ticks was match head size.  There is no rash at the bite site.  Bite site is minimally inflamed and continues to itch.  He has had a dry mouth with this as well.  His father was a diabetic and passed at age 30 from an MI.  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  Patient has no allergy symptoms or cough or cold symptoms.  Outpatient Medications Prior to Visit  Medication Sig Dispense Refill  . ondansetron (ZOFRAN) 4 MG tablet Take 1 tablet (4 mg total) by mouth every 8 (eight) hours as needed for nausea or vomiting. 20 tablet 0   No facility-administered medications prior to visit.     ROS Review of Systems  Constitutional: Positive for fatigue. Negative for diaphoresis and unexpected weight change.  HENT: Negative.  Negative for congestion, postnasal drip, rhinorrhea, sinus pain and sneezing.   Eyes: Negative for photophobia and visual disturbance.  Respiratory: Negative.   Cardiovascular: Negative.   Gastrointestinal: Negative.   Endocrine: Negative for polyphagia and polyuria.  Genitourinary: Negative.   Musculoskeletal: Positive for arthralgias. Negative for myalgias.  Skin: Negative for pallor and rash.  Allergic/Immunologic: Negative for immunocompromised state.  Neurological: Positive for headaches. Negative for weakness.  Hematological: Does not bruise/bleed easily.  Psychiatric/Behavioral: Negative.     Objective:  BP 136/80   Pulse 60   Temp 98.1 F (36.7 C)   Ht 5\' 11"  (1.803 m)   Wt 215 lb 4 oz (97.6 kg)   SpO2 97%   BMI 30.02 kg/m   BP Readings from Last 3 Encounters:  04/17/18 136/80    01/20/18 128/78  01/06/18 126/70    Wt Readings from Last 3 Encounters:  04/17/18 215 lb 4 oz (97.6 kg)  01/20/18 220 lb 4 oz (99.9 kg)  01/06/18 218 lb (98.9 kg)    Physical Exam  Constitutional: He is oriented to person, place, and time. He appears well-developed and well-nourished. No distress.  HENT:  Head: Normocephalic and atraumatic.  Right Ear: External ear normal.  Left Ear: External ear normal.  Mouth/Throat: Oropharynx is clear and moist.  Eyes: Pupils are equal, round, and reactive to light. Conjunctivae and EOM are normal. Right eye exhibits no discharge. Left eye exhibits no discharge. No scleral icterus.  Neck: Normal range of motion. No JVD present. No tracheal deviation present. No thyromegaly present.  Cardiovascular: Normal rate, regular rhythm and normal heart sounds.  Pulmonary/Chest: Effort normal and breath sounds normal.  Lymphadenopathy:    He has no cervical adenopathy.  Neurological: He is alert and oriented to person, place, and time.  Skin: No rash noted. He is not diaphoretic. No erythema. No pallor.     Psychiatric: He has a normal mood and affect. His behavior is normal.    Lab Results  Component Value Date   WBC 5.2 09/28/2010   HGB 14.9 09/28/2010   HCT 42.7 09/28/2010   PLT 203.0 09/28/2010   GLUCOSE 87 09/28/2010   CHOL 198 09/28/2010   TRIG 93.0 09/28/2010   HDL 37.70 (L) 09/28/2010   LDLCALC 142 (H)  09/28/2010   ALT 25 09/28/2010   AST 23 09/28/2010   NA 137 09/28/2010   K 4.1 09/28/2010   CL 99 09/28/2010   CREATININE 0.8 09/28/2010   BUN 14 09/28/2010   CO2 29 09/28/2010   TSH 1.98 09/28/2010    No results found.  Assessment & Plan:   Matthew Glover was seen today for fever.  Diagnoses and all orders for this visit:  Tick bite, initial encounter -     doxycycline (VIBRA-TABS) 100 MG tablet; Take 1 tablet (100 mg total) by mouth 2 (two) times daily.   I have discontinued Matthew Glover. Matthew Glover's ondansetron. I am also having  him start on doxycycline.  Meds ordered this encounter  Medications  . doxycycline (VIBRA-TABS) 100 MG tablet    Sig: Take 1 tablet (100 mg total) by mouth 2 (two) times daily.    Dispense:  20 tablet    Refill:  0   Photosensitivity precautions emphasized to patient.  He will follow-up PRN.  Follow-up: Return if symptoms worsen or fail to improve.  Mliss Sax, MD

## 2018-04-17 NOTE — Patient Instructions (Signed)
Tick Bite Information, Adult Ticks are insects that draw blood for food. Most ticks live in shrubs and grassy areas. They climb onto people and animals that brush against the leaves and grasses that they rest on. Then they bite, attaching themselves to the skin. Most ticks are harmless, but some ticks carry germs that can spread to a person through a bite and cause a disease. To reduce your risk of getting a disease from a tick bite, it is important to take steps to prevent tick bites. It is also important to check for ticks after being outdoors. If you find that a tick has attached to you, watch for symptoms of disease. How can I prevent tick bites? Take these steps to help prevent tick bites when you are outdoors in an area where ticks are found:  Use insect repellent that has DEET (20% or higher), picaridin, or IR3535 in it. Use it on: ? Skin that is showing. ? The top of your boots. ? Your pant legs. ? Your sleeve cuffs.  For repellent products that contain permethrin, follow product instructions. Use these products on: ? Clothing. ? Gear. ? Boots. ? Tents.  Wear protective clothing. Long sleeves and long pants offer the best protection from ticks.  Wear light-colored clothing so you can see ticks more easily.  Tuck your pant legs into your socks.  If you go walking on a trail, stay in the middle of the trail so your skin, hair, and clothing do not touch the bushes.  Avoid walking through areas with long grass.  Check for ticks on your clothing, hair, and skin often while you are outside, and check again before you go inside. Make sure to check the places that ticks attach themselves most often. These places include the scalp, neck, armpits, waist, groin, and joint areas. Ticks that carry a disease called Lyme disease have to be attached to the skin for 24-48 hours. Checking for ticks every day will lessen your risk of this and other diseases.  When you come indoors, wash your  clothes and take a shower or a bath right away. Dry your clothes in a dryer on high heat for at least 60 minutes. This will kill any ticks in your clothes.  What is the proper way to remove a tick? If you find a tick on your body, remove it as soon as possible. Removing a tick sooner rather than later can prevent germs from passing from the tick to your body. To remove a tick that is crawling on your skin but has not bitten:  Go outdoors and brush the tick off.  Remove the tick with tape or a lint roller.  To remove a tick that is attached to your skin:  Wash your hands.  If you have latex gloves, put them on.  Use tweezers, curved forceps, or a tick-removal tool to gently grasp the tick as close to your skin and the tick's head as possible.  Gently pull with steady, upward pressure until the tick lets go. When removing the tick: ? Take care to keep the tick's head attached to its body. ? Do not twist or jerk the tick. This can make the tick's head or mouth break off. ? Do not squeeze or crush the tick's body. This could force disease-carrying fluids from the tick into your body.  Do not try to remove a tick with heat, alcohol, petroleum jelly, or fingernail polish. Using these methods can cause the tick to salivate and   regurgitate into your bloodstream, increasing your risk of getting a disease. What should I do after removing a tick?  Clean the bite area with soap and water, rubbing alcohol, or an iodine scrub.  If an antiseptic cream or ointment is available, apply a small amount to the bite site.  Wash and disinfect any instruments that you used to remove the tick. How should I dispose of a tick? To dispose of a live tick, use one of these methods:  Place it in rubbing alcohol.  Place it in a sealed bag or container.  Wrap it tightly in tape.  Flush it down the toilet.  Contact a health care provider if:  You have symptoms of a disease after a tick bite. Symptoms of a  tick-borne disease can occur from moments after the tick bites to up to 30 days after a tick is removed. Symptoms include: ? Muscle, joint, or bone pain. ? Difficulty walking or moving your legs. ? Numbness in the legs. ? Paralysis. ? Red rash around the tick bite area that is shaped like a target or a "bull's-eye." ? Redness and swelling in the area of the tick bite. ? Fever. ? Repeated vomiting. ? Diarrhea. ? Weight loss. ? Tender, swollen lymph glands. ? Shortness of breath. ? Cough. ? Pain in the abdomen. ? Headache. ? Abnormal tiredness. ? A change in your level of consciousness. ? Confusion. Get help right away if:  You are not able to remove a tick.  A part of a tick breaks off and gets stuck in your skin.  Your symptoms get worse. Summary  Ticks may carry germs that can spread to a person through a bite and cause disease.  Wear protective clothing and use insect repellent to prevent tick bites. Follow product instructions.  If you find a tick on your body, remove it as soon as possible. If the tick is attached, do not try to remove with heat, alcohol, petroleum jelly, or fingernail polish.  Remove the attached tick using tweezers, curved forceps, or a tick-removal tool. Gently pull with steady, upward pressure until the tick lets go. Do not twist or jerk the tick. Do not squeeze or crush the tick's body.  If you have symptoms after being bitten by a tick, contact a health care provider. This information is not intended to replace advice given to you by your health care provider. Make sure you discuss any questions you have with your health care provider. Document Released: 10/12/2000 Document Revised: 07/27/2016 Document Reviewed: 07/27/2016 Elsevier Interactive Patient Education  2018 [employer]. Doxycycline tablets or capsules What is this medicine? DOXYCYCLINE (dox i SYE kleen) is a tetracycline antibiotic. It kills certain bacteria or stops their growth. It is  used to treat many kinds of infections, like dental, skin, respiratory, and urinary tract infections. It also treats acne, Lyme disease, malaria, and certain sexually transmitted infections. This medicine may be used for other purposes; ask your health care provider or pharmacist if you have questions. COMMON BRAND NAME(S): Acticlate, Adoxa, Adoxa CK, Adoxa Pak, Adoxa TT, Alodox, Avidoxy, Doxal, Mondoxyne NL, Monodox, Morgidox 1x, Morgidox 1x Kit, Morgidox 2x, Morgidox 2x Kit, NutriDox, Ocudox, TARGADOX, Vibra-Tabs, Vibramycin What should I tell my health care provider before I take this medicine? They need to know if you have any of these conditions: -liver disease -long exposure to sunlight like working outdoors -stomach problems like colitis -an unusual or allergic reaction to doxycycline, tetracycline antibiotics, other medicines, foods, dyes, or preservatives -  pregnant or trying to get pregnant -breast-feeding How should I use this medicine? Take this medicine by mouth with a full glass of water. Follow the directions on the prescription label. It is best to take this medicine without food, but if it upsets your stomach take it with food. Take your medicine at regular intervals. Do not take your medicine more often than directed. Take all of your medicine as directed even if you think you are better. Do not skip doses or stop your medicine early. Talk to your pediatrician regarding the use of this medicine in children. While this drug may be prescribed for selected conditions, precautions do apply. Overdosage: If you think you have taken too much of this medicine contact a poison control center or emergency room at once. NOTE: This medicine is only for you. Do not share this medicine with others. What if I miss a dose? If you miss a dose, take it as soon as you can. If it is almost time for your next dose, take only that dose. Do not take double or extra doses. What may interact with this  medicine? -antacids -barbiturates -birth control pills -bismuth subsalicylate -carbamazepine -methoxyflurane -other antibiotics -phenytoin -vitamins that contain iron -warfarin This list may not describe all possible interactions. Give your health care provider a list of all the medicines, herbs, non-prescription drugs, or dietary supplements you use. Also tell them if you smoke, drink alcohol, or use illegal drugs. Some items may interact with your medicine. What should I watch for while using this medicine? Tell your doctor or health care professional if your symptoms do not improve. Do not treat diarrhea with over the counter products. Contact your doctor if you have diarrhea that lasts more than 2 days or if it is severe and watery. Do not take this medicine just before going to bed. It may not dissolve properly when you lay down and can cause pain in your throat. Drink plenty of fluids while taking this medicine to also help reduce irritation in your throat. This medicine can make you more sensitive to the sun. Keep out of the sun. If you cannot avoid being in the sun, wear protective clothing and use sunscreen. Do not use sun lamps or tanning beds/booths. Birth control pills may not work properly while you are taking this medicine. Talk to your doctor about using an extra method of birth control. If you are being treated for a sexually transmitted infection, avoid sexual contact until you have finished your treatment. Your sexual partner may also need treatment. Avoid antacids, aluminum, calcium, magnesium, and iron products for 4 hours before and 2 hours after taking a dose of this medicine. If you are using this medicine to prevent malaria, you should still protect yourself from contact with mosquitos. Stay in screened-in areas, use mosquito nets, keep your body covered, and use an insect repellent. What side effects may I notice from receiving this medicine? Side effects that you  should report to your doctor or health care professional as soon as possible: -allergic reactions like skin rash, itching or hives, swelling of the face, lips, or tongue -difficulty breathing -fever -itching in the rectal or genital area -pain on swallowing -redness, blistering, peeling or loosening of the skin, including inside the mouth -severe stomach pain or cramps -unusual bleeding or bruising -unusually weak or tired -yellowing of the eyes or skin Side effects that usually do not require medical attention (report to your doctor or health care professional if they continue  or are bothersome): -diarrhea -loss of appetite -nausea, vomiting This list may not describe all possible side effects. Call your doctor for medical advice about side effects. You may report side effects to FDA at 1-800-FDA-1088. Where should I keep my medicine? Keep out of the reach of children. Store at room temperature, below 30 degrees C (86 degrees F). Protect from light. Keep container tightly closed. Throw away any unused medicine after the expiration date. Taking this medicine after the expiration date can make you seriously ill. NOTE: This sheet is a summary. It may not cover all possible information. If you have questions about this medicine, talk to your doctor, pharmacist, or health care provider.  2018 Elsevier/Gold Standard (2015-11-16 17:11:22)

## 2018-04-28 ENCOUNTER — Encounter: Payer: Self-pay | Admitting: Family Medicine

## 2018-04-28 ENCOUNTER — Ambulatory Visit: Payer: 59 | Admitting: Family Medicine

## 2018-04-28 ENCOUNTER — Telehealth: Payer: Self-pay | Admitting: Family Medicine

## 2018-04-28 VITALS — BP 141/86 | HR 86 | Temp 99.8°F | Ht 71.0 in | Wt 212.0 lb

## 2018-04-28 DIAGNOSIS — J029 Acute pharyngitis, unspecified: Secondary | ICD-10-CM | POA: Diagnosis not present

## 2018-04-28 LAB — CBC WITH DIFFERENTIAL/PLATELET
Basophils Absolute: 0 K/uL (ref 0.0–0.1)
Basophils Relative: 0.6 % (ref 0.0–3.0)
Eosinophils Absolute: 0 K/uL (ref 0.0–0.7)
Eosinophils Relative: 0.2 % (ref 0.0–5.0)
HCT: 43 % (ref 39.0–52.0)
Hemoglobin: 14.8 g/dL (ref 13.0–17.0)
Lymphocytes Relative: 13.8 % (ref 12.0–46.0)
Lymphs Abs: 1.1 K/uL (ref 0.7–4.0)
MCHC: 34.5 g/dL (ref 30.0–36.0)
MCV: 90 fl (ref 78.0–100.0)
Monocytes Absolute: 1 K/uL (ref 0.1–1.0)
Monocytes Relative: 12.9 % — ABNORMAL HIGH (ref 3.0–12.0)
Neutro Abs: 5.6 K/uL (ref 1.4–7.7)
Neutrophils Relative %: 72.5 % (ref 43.0–77.0)
Platelets: 222 K/uL (ref 150.0–400.0)
RBC: 4.77 Mil/uL (ref 4.22–5.81)
RDW: 12.5 % (ref 11.5–15.5)
WBC: 7.7 K/uL (ref 4.0–10.5)

## 2018-04-28 LAB — POCT RAPID STREP A (OFFICE): Rapid Strep A Screen: NEGATIVE

## 2018-04-28 NOTE — Patient Instructions (Signed)
Nice to meet you Please continue tylenol and ibuprofen for fever as needed.  I will call you with the results from today    Infectious Mononucleosis Infectious mononucleosis is an infection that is caused by a virus. This illness is often called "mono." You can get mono from close contact with someone who is infected (it is contagious). If you have mono, you may feel tired and have a sore throat, a headache, or a fever. Mono is usually not serious, but some people may need to be treated for it in the hospital. Follow these instructions at home: Medicines  Take over-the-counter and prescription medicines only as told by your doctor.  Do not take ampicillin or amoxicillin. This may cause a rash.  If you are under 18, do not take aspirin. Activity  Rest as needed.  Do not do any of the following activities until your doctor says that they are safe for you: ? Contact sports. You may need to wait a month or longer before you play sports. ? Exercise that requires a lot of energy. ? Lifting heavy things.  Slowly go back to your normal activities after your fever is gone, or when your doctor says that you can. Be sure to rest when you get tired. Preventing infectious mononucleosis  Avoid contact with people who have mono. An infected person may not seem sick, but he or she can still spread the virus.  Avoid sharing forks, spoons, knives (utensils), drinking cups, or toothbrushes.  Wash your hands often with soap and water. If you cannot use soap and water, use hand sanitizer.  Use the inside of your elbow to cover your mouth when you cough or sneeze. General instructions  Avoid kissing or sharing forks, spoons, knives, or drinking cups until your doctor approves.  Drink enough fluid to keep your pee (urine) clear or pale yellow.  Do not drink alcohol.  If you have a sore throat: ? Rinse your mouth (gargle) with a salt-water mixture 3-4 times a day or as needed. To make a salt-water  mixture, completely dissolve -1 tsp of salt in 1 cup of warm water. ? Eat soft foods. Cold foods such as ice cream or frozen ice pops can help your throat feel better. ? Try sucking on hard candy.  Wash your hands often with soap and water. If you cannot use soap and water, use hand sanitizer. Contact a doctor if:  Your fever is not gone after 10 days.  You have swelling by your jaw or neck (swollen lymph nodes), and the swelling does not go away after 4 weeks.  Your activity level is not back to normal after 2 months.  Your skin or the white parts of your eyes turn yellow (jaundice).  You have trouble pooping (have constipation). This may mean that you: ? Poop (have a bowel movement) fewer times in a week than normal. ? Have a hard time pooping. ? Have poop that is dry, hard, or bigger than normal. Get help right away if:  You have very bad pain in your: ? Belly (abdomen). ? Shoulder.  You are drooling.  You have trouble swallowing.  You have trouble breathing.  You have a stiff neck.  You have a very bad headache.  You cannot stop throwing up (vomiting).  You have jerky movements that you cannot control (seizures).  You are confused.  You have trouble with balance.  Your nose or gums start to bleed.  You have signs of body fluid  loss (dehydration). These may include: ? Weakness. ? Sunken eyes. ? Pale skin. ? Dry mouth. ? Fast breathing or heartbeat. Summary  Infectious mononucleosis, or "mono," is an infection that is caused by a virus.  Mono is usually not serious, but some people may need to be treated for it in the hospital.  You should not play contact sports or lift heavy things until your doctor says that you can.  Wash your hands often with soap and water. If you cannot use soap and water, use hand sanitizer. This information is not intended to replace advice given to you by your health care provider. Make sure you discuss any questions you have  with your health care provider. Document Released: 10/03/2009 Document Revised: 07/03/2016 Document Reviewed: 07/03/2016 Elsevier Interactive Patient Education  2017 [employer]

## 2018-04-28 NOTE — Assessment & Plan Note (Signed)
Rapid strep negative and has recently completed doxycycline. Likely mono based on symptoms.  - CBC  - counseled on supportive care  - given indications to return.

## 2018-04-28 NOTE — Progress Notes (Signed)
Matthew Glover - 32 y.o. male MRN [id number]  Date of birth: January 06, 1986  SUBJECTIVE:  Including CC & ROS.  Chief Complaint  Patient presents with  . Fever    Matthew Glover is a 32 y.o. male that is here today for fever, body ache and sore throat. Symptoms have been ongoing for two days. He has not been around similar symptoms. He completed Doxycycline yesterday treated for a tick bite. His temperature was 100.3 this morning, he has been taking Tylenol. Denies any travel. No new foods. No abdominal pain, dysuria, constipation or diarrhea. No rashes. No ear pain.    Review of Systems  Constitutional: Positive for activity change, fatigue and fever.  HENT: Positive for sore throat.   Eyes: Negative for redness.  Respiratory: Negative for shortness of breath.   Cardiovascular: Negative for chest pain.  Gastrointestinal: Negative for abdominal pain.  Genitourinary: Negative for dysuria.  Musculoskeletal: Negative for gait problem.  Skin: Negative for color change.  Neurological: Positive for headaches.    HISTORY: Past Medical, Surgical, Social, and Family History Reviewed & Updated per EMR.   Pertinent Historical Findings include:  Past Medical History:  Diagnosis Date  . ADD (attention deficit disorder)    dx clarified 08/2011>start adderall  . ALLERGIC RHINITIS   . ANXIETY   . DEPRESSION    grief rxn after sudden dealth of father 08/2009  . INSOMNIA   . PNEUMONIA    bilateral, req bipap 04/2010 hosp    Past Surgical History:  Procedure Laterality Date  . APPENDECTOMY  2010    Allergies  Allergen Reactions  . Ibuprofen   . Promethazine Hcl     Pt states    Family History  Problem Relation Age of Onset  . Diabetes Other        positive in parent & grandparent not sure which 1  . Hyperlipidemia Other   . Heart disease Other   . Hypertension Other      Social History   Socioeconomic History  . Marital status: Single    Spouse name: Not on file  .  Number of children: Not on file  . Years of education: Not on file  . Highest education level: Not on file  Occupational History  . Not on file  Social Needs  . Financial resource strain: Not on file  . Food insecurity:    Worry: Not on file    Inability: Not on file  . Transportation needs:    Medical: Not on file    Non-medical: Not on file  Tobacco Use  . Smoking status: Never Smoker  . Smokeless tobacco: Never Used  Substance and Sexual Activity  . Alcohol use: Yes    Comment: 2/week  . Drug use: No  . Sexual activity: Not on file  Lifestyle  . Physical activity:    Days per week: Not on file    Minutes per session: Not on file  . Stress: Not on file  Relationships  . Social connections:    Talks on phone: Not on file    Gets together: Not on file    Attends religious service: Not on file    Active member of club or organization: Not on file    Attends meetings of clubs or organizations: Not on file    Relationship status: Not on file  . Intimate partner violence:    Fear of current or ex partner: Not on file    Emotionally abused: Not on  file    Physically abused: Not on file    Forced sexual activity: Not on file  Other Topics Concern  . Not on file  Social History Narrative   Single, has girlfriend. 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)     PHYSICAL EXAM:  VS: BP (!) 141/86 (BP Location: Left Arm, Patient Position: Sitting, Cuff Size: Normal)   Pulse 86   Temp 99.8 F (37.7 C) (Oral)   Ht 5\' 11"  (1.803 m)   Wt 212 lb (96.2 kg)   SpO2 98%   BMI 29.57 kg/m  Physical Exam Gen: NAD, alert, cooperative with exam, ENT: normal lips, normal nasal mucosa, tympanic membranes clear and intact bilaterally, tonsillar exudates  Eye: normal EOM, normal conjunctiva and lids CV:  no edema, +2 pedal pulses, regular rate and rhythm, S1-S2   Resp: no accessory muscle use, non-labored, clear to auscultation bilaterally, no crackles  or wheezes Skin: no rashes, no areas of induration  Neuro: normal tone, normal sensation to touch Psych:  normal insight, alert and oriented MSK: Normal gait, normal strength       ASSESSMENT & PLAN:   Sore throat Rapid strep negative and has recently completed doxycycline. Likely mono based on symptoms.  - CBC  - counseled on supportive care  - given indications to return.

## 2018-04-28 NOTE — Telephone Encounter (Signed)
Informed patient of his results.   Myra RudeSchmitz, Dylen Mcelhannon E, MD [facility] 04/28/2018, 4:59 PM

## 2018-04-29 ENCOUNTER — Ambulatory Visit: Payer: 59 | Admitting: Internal Medicine

## 2018-04-29 ENCOUNTER — Encounter: Payer: Self-pay | Admitting: Internal Medicine

## 2018-04-29 ENCOUNTER — Ambulatory Visit: Payer: Self-pay

## 2018-04-29 ENCOUNTER — Other Ambulatory Visit (INDEPENDENT_AMBULATORY_CARE_PROVIDER_SITE_OTHER): Payer: 59

## 2018-04-29 ENCOUNTER — Ambulatory Visit: Payer: Self-pay | Admitting: *Deleted

## 2018-04-29 VITALS — BP 110/58 | HR 104 | Temp 102.1°F | Resp 16 | Ht 71.0 in | Wt 210.2 lb

## 2018-04-29 DIAGNOSIS — J039 Acute tonsillitis, unspecified: Secondary | ICD-10-CM | POA: Diagnosis not present

## 2018-04-29 LAB — COMPREHENSIVE METABOLIC PANEL
ALK PHOS: 58 U/L (ref 39–117)
ALT: 24 U/L (ref 0–53)
AST: 15 U/L (ref 0–37)
Albumin: 4.3 g/dL (ref 3.5–5.2)
BILIRUBIN TOTAL: 0.9 mg/dL (ref 0.2–1.2)
BUN: 14 mg/dL (ref 6–23)
CALCIUM: 9 mg/dL (ref 8.4–10.5)
CHLORIDE: 98 meq/L (ref 96–112)
CO2: 30 meq/L (ref 19–32)
Creatinine, Ser: 0.99 mg/dL (ref 0.40–1.50)
GFR: 94.35 mL/min (ref >60.00)
Glucose, Bld: 99 mg/dL (ref 70–99)
Potassium: 4 mEq/L (ref 3.5–5.1)
Sodium: 135 mEq/L (ref 135–145)
Total Protein: 7.4 g/dL (ref 6.0–8.3)

## 2018-04-29 LAB — MONONUCLEOSIS SCREEN: MONO SCREEN: POSITIVE — AB

## 2018-04-29 LAB — SEDIMENTATION RATE: Sed Rate: 18 mm/hr — ABNORMAL HIGH (ref 0–15)

## 2018-04-29 MED ORDER — HYDROCODONE-ACETAMINOPHEN 5-325 MG PO TABS: 1.0000 | ORAL_TABLET | Freq: Four times a day (QID) | ORAL | 0 refills | Status: DC | PRN

## 2018-04-29 MED ORDER — METHYLPREDNISOLONE ACETATE 80 MG/ML IJ SUSP
120.0000 mg | Freq: Once | INTRAMUSCULAR | Status: AC
Start: 2018-04-29 — End: 2018-04-29
  Administered 2018-04-29: 120 mg via INTRAMUSCULAR

## 2018-04-29 NOTE — Progress Notes (Signed)
Subjective:  Patient ID: Matthew Glover, male    DOB: April 04, 1986  Age: 32 y.o. MRN: [id number]  CC: Sore Throat   HPI JOREN REHM presents for a 3-day history of headache, fatigue, muscle aches, sore throat, fever, and chills.  The symptoms started on the day that he had just completed a 3-week course of doxycycline for a tick bite.  He denies cough, shortness of breath, abdominal pain, rash, lymphadenopathy, nausea, or vomiting.  He was seen 1 day prior to this visit and his CBC showed a mild monocytosis.  Strep screening was negative.  No outpatient medications prior to visit.   No facility-administered medications prior to visit.     ROS Review of Systems  Constitutional: Positive for chills, fatigue and fever. Negative for appetite change and diaphoresis.  HENT: Positive for sore throat. Negative for facial swelling, sinus pressure, trouble swallowing and voice change.   Respiratory: Negative for cough, shortness of breath and wheezing.   Cardiovascular: Negative for chest pain, palpitations and leg swelling.  Gastrointestinal: Negative for abdominal pain, constipation, diarrhea, nausea and vomiting.  Genitourinary: Negative.  Negative for difficulty urinating and dysuria.  Musculoskeletal: Positive for myalgias. Negative for arthralgias and back pain.  Skin: Negative.  Negative for color change, pallor and rash.  Neurological: Positive for headaches. Negative for dizziness, weakness and light-headedness.  Hematological: Negative for adenopathy. Does not bruise/bleed easily.  Psychiatric/Behavioral: Negative.     Objective:  BP (!) 110/58 (BP Location: Left Arm, Patient Position: Sitting, Cuff Size: Large)   Pulse (!) 104   Temp (!) 102.1 F (38.9 C) (Oral)   Resp 16   Ht 5\' 11"  (1.803 m)   Wt 210 lb 4 oz (95.4 kg)   SpO2 96%   BMI 29.32 kg/m   BP Readings from Last 3 Encounters:  04/29/18 (!) 110/58  04/28/18 (!) 141/86  04/17/18 136/80    Wt Readings from  Last 3 Encounters:  04/29/18 210 lb 4 oz (95.4 kg)  04/28/18 212 lb (96.2 kg)  04/17/18 215 lb 4 oz (97.6 kg)    Physical Exam  Constitutional: He is oriented to person, place, and time. He appears well-developed and well-nourished.  Non-toxic appearance. He appears ill.  HENT:  Mouth/Throat: Uvula is midline and mucous membranes are normal. Mucous membranes are not pale, not dry and not cyanotic. No uvula swelling. Oropharyngeal exudate, posterior oropharyngeal edema and posterior oropharyngeal erythema present. No tonsillar abscesses. Tonsils are 2+ on the right. Tonsils are 2+ on the left.  There is moderate bilateral tonsillar hypertrophy with erythema and exudate.  There is no asymmetry or bulging.  Neck: Normal range of motion. Neck supple.  Cardiovascular: Normal rate, regular rhythm and normal heart sounds. Exam reveals no friction rub.  No murmur heard. Pulmonary/Chest: Effort normal and breath sounds normal. He has no wheezes. He has no rhonchi. He has no rales.  Abdominal: Soft. Normal appearance and bowel sounds are normal. He exhibits no distension and no mass. There is splenomegaly and hepatomegaly. There is no hepatosplenomegaly. There is no tenderness. There is no guarding. No hernia.  Musculoskeletal: Normal range of motion. He exhibits no edema, tenderness or deformity.  Lymphadenopathy:       Head (right side): No tonsillar and no occipital adenopathy present.       Head (left side): No tonsillar and no occipital adenopathy present.    He has no cervical adenopathy.    He has no axillary adenopathy.  Right: No inguinal, no supraclavicular and no epitrochlear adenopathy present.       Left: No inguinal, no supraclavicular and no epitrochlear adenopathy present.  Neurological: He is alert and oriented to person, place, and time.  Skin: Skin is warm and dry. No rash noted. No erythema. No pallor.    Lab Results  Component Value Date   WBC 7.7 04/28/2018   HGB 14.8  04/28/2018   HCT 43.0 04/28/2018   PLT 222.0 04/28/2018   GLUCOSE 87 09/28/2010   CHOL 198 09/28/2010   TRIG 93.0 09/28/2010   HDL 37.70 (L) 09/28/2010   LDLCALC 142 (H) 09/28/2010   ALT 25 09/28/2010   AST 23 09/28/2010   NA 137 09/28/2010   K 4.1 09/28/2010   CL 99 09/28/2010   CREATININE 0.8 09/28/2010   BUN 14 09/28/2010   CO2 29 09/28/2010   TSH 1.98 09/28/2010    No results found.  Assessment & Plan:   Caryn BeeKevin was seen today for sore throat.  Diagnoses and all orders for this visit:  Tonsillitis with exudate- He has an acute onset tonsillitis with exudate.  His CBC yesterday showed a normal white count but an increase in monocytes.  His strep testing is again negative today.  I have ordered lab work to confirm that this is acute mononucleosis.  He is quite symptomatic with this also recommended a course of steroids and hydrocodone with acetaminophen as needed for discomfort.  He cannot take NSAIDs due to history of anaphylaxis.  He was asked to stay at strict bedrest for the next week. -     Cancel: Epstein-Barr virus nuclear antigen antibody, IgG; Future -     Mononucleosis screen; Future -     Epstein-Barr virus VCA antibody panel; Future -     Comprehensive metabolic panel; Future -     Sedimentation rate; Future -     Discontinue: HYDROcodone-acetaminophen (NORCO/VICODIN) 5-325 MG tablet; Take 1 tablet by mouth every 6 (six) hours as needed for moderate pain. -     HYDROcodone-acetaminophen (NORCO/VICODIN) 5-325 MG tablet; Take 1 tablet by mouth every 6 (six) hours as needed for moderate pain. -     methylPREDNISolone acetate (DEPO-MEDROL) injection 120 mg   I am having Josefina DoKevin R. Lenig maintain his HYDROcodone-acetaminophen. We administered methylPREDNISolone acetate.  Meds ordered this encounter  Medications  . DISCONTD: HYDROcodone-acetaminophen (NORCO/VICODIN) 5-325 MG tablet    Sig: Take 1 tablet by mouth every 6 (six) hours as needed for moderate pain.     Dispense:  15 tablet    Refill:  0  . HYDROcodone-acetaminophen (NORCO/VICODIN) 5-325 MG tablet    Sig: Take 1 tablet by mouth every 6 (six) hours as needed for moderate pain.    Dispense:  15 tablet    Refill:  0  . methylPREDNISolone acetate (DEPO-MEDROL) injection 120 mg     Follow-up: Return in about 1 week (around 05/06/2018).  Sanda Lingerhomas Kohner Orlick, MD

## 2018-04-29 NOTE — Patient Instructions (Signed)
Infectious Mononucleosis  Infectious mononucleosis is a viral infection. It is often referred to as "mono." It causes symptoms that affect various areas of the body, including the throat, upper air passages, and lymph glands. The liver or spleen may also be affected.  The virus spreads from person to person (is contagious) through close contact. The illness is usually not serious, and it typically goes away in 2-4 weeks without treatment. In rare cases, symptoms can be more severe and last longer, sometimes up to several months.  What are the causes?  This condition is commonly caused by the Epstein-Barr virus. This virus spreads through:   Contact with an infected person's saliva or other bodily fluids, often through:  ? Kissing.  ? Sexual contact.  ? Coughing.  ? Sneezing.   Sharing utensils or drinking glasses that were recently used by an infected person.   Blood transfusions.   Organ transplantation.    What increases the risk?  You are more likely to develop this condition if:   You are 15-24 years old.    What are the signs or symptoms?  Symptoms of this condition usually appear 4-6 weeks after infection. Symptoms may develop slowly and occur at different times. Common symptoms include:   Sore throat.   Headache.   Extreme fatigue.   Muscle aches.   Swollen glands.   Fever.   Poor appetite.   Rash.    Other symptoms include:   Enlarged liver or spleen.   Nausea.   Abdominal pain.    How is this diagnosed?  This condition may be diagnosed based on:   Your medical history.   Your symptoms.   A physical exam.   Blood tests to confirm the diagnosis.    How is this treated?  There is no cure for this condition. Infectious mononucleosis usually goes away on its own with time. Treatment can help relieve symptoms and may include:   Taking medicines to relieve pain and fever.   Drinking plenty of fluids.   Getting a lot of rest.   Medicine (corticosteroids)to reduce swelling. This may be used  if swelling in the throat causes breathing or swallowing problems.    In some severe cases, treatment has to be given in a hospital.  Follow these instructions at home:  Medicines   Take over-the-counter and prescription medicines only as told by your health care provider.   Do not take ampicillin or amoxicillin. This may cause a rash.   If you are under 18, do not take aspirin because of the association with Reye syndrome.  Activity   Rest as needed.   Do not participate in any of the following activities until your health care provider approves:  ? Contact sports. You may need to wait at least a month before participating in sports.  ? Exercise that requires a lot of energy.  ? Heavy lifting.   Gradually resume your normal activities after your fever is gone, or when your health care provider tells you that you can. Be sure to rest when you get tired.  General instructions   Avoid kissing or sharing utensils or drinking glasses until your health care provider tells you that you are no longer contagious.   Drink enough fluid to keep your urine clear or pale yellow.   Do not drink alcohol.   If you have a sore throat:  ? Gargle with a salt-water mixture 3-4 times a day or as needed. To make a salt-water mixture,   completely dissolve -1 tsp of salt in 1 cup of warm water.  ? Eat soft foods. Cold foods such as ice cream or frozen ice pops can soothe a sore throat.  ? Try sucking on hard candy.   Wash your hands often with soap and water to avoid spreading the infection. If soap and water are not available, use hand sanitizer.  How is this prevented?   Avoid contact with people who are infected with mononucleosis. An infected person may not always appear ill, but he or she can still spread the virus.   Avoid sharing utensils, drinking glasses, or toothbrushes.   Wash your hands frequently with soap and water. If soap and water are not available, use hand sanitizer.   Use the inside of your elbow to cover  your mouth when coughing or sneezing.  Contact a health care provider if:   Your fever is not gone after 10 days.   You have swollen lymph nodes that are not back to normal after 4 weeks.   Your activity level is not back to normal after 2 months.   Your skin or the white parts of your eyes turn yellow (jaundice).   You have constipation. This may mean that you have:  ? Fewer bowel movements in a week than normal.  ? Difficulty having a bowel movement.  ? Stools that are dry, hard, or larger than normal.  Get help right away if:   You have severe pain in your abdomen or shoulder.   You are drooling.   You have trouble swallowing.   You have trouble breathing.   You develop a stiff neck.   You develop a severe headache.   You cannot stop vomiting.   You have jerky movements that you cannot control (seizures).   You are confused.   You have trouble with balance.   Your nose or gums begin to bleed.   You have signs of dehydration. These may include:  ? Weakness.  ? Sunken eyes.  ? Pale skin.  ? Dry mouth.  ? Rapid breathing or pulse.  Summary   Infectious mononucleosis, or "mono," is an infection caused by the Epstein-Barr virus.   The virus that causes this condition is spread through bodily fluids. The virus is most commonly spread by kissing or sharing drinks or utensils with an infected person.   You are more likely to develop this infection if you are 15-24 years old.   Symptoms of this condition can include sore throat, headache, fever, swollen glands, muscle aches, extreme fatigue, and swollen liver or spleen.   There is no cure for this condition. The goal of treatment is to help relieve symptoms. Treatment may include drinking plenty of water, getting a lot of rest, and taking pain relievers.  This information is not intended to replace advice given to you by your health care provider. Make sure you discuss any questions you have with your health care provider.  Document Released:  10/12/2000 Document Revised: 07/03/2016 Document Reviewed: 07/03/2016  Elsevier Interactive Patient Education  2017 [employer]

## 2018-04-29 NOTE — Telephone Encounter (Signed)
Patient called and says "I have body aches, chest tightness, my throat hurts, I have a fever 103, I feel worse today than yesterday when I went to the office." I asked what made him call today, he says "I feel worse and at this point, I don't know what to do. My strep was negative and the doctor mentioned a mono test, so I will do whatever I need to do, come in or go to the emergency room." Appointment scheduled for today at 1130 with Dr. Yetta BarreJones, care advice given, patient verbalized understanding.  Reason for Disposition . Requesting regular office appointment  Answer Assessment - Initial Assessment Questions 1. REASON FOR CALL or QUESTION: "What is your reason for calling today?" or "How can I best help you?" or "What question do you have that I can help answer?"     My symptoms are worse  Protocols used: INFORMATION ONLY CALL-A-AH

## 2018-04-30 LAB — EPSTEIN-BARR VIRUS VCA ANTIBODY PANEL
EBV NA IGG: 548 U/mL — AB
EBV VCA IGG: 61.2 U/mL — AB

## 2018-08-01 ENCOUNTER — Ambulatory Visit: Payer: 59 | Admitting: Nurse Practitioner

## 2018-08-01 ENCOUNTER — Ambulatory Visit: Payer: Self-pay | Admitting: Family

## 2018-08-01 ENCOUNTER — Encounter: Payer: Self-pay | Admitting: Nurse Practitioner

## 2018-08-01 VITALS — BP 128/84 | HR 53 | Temp 98.2°F | Ht 71.0 in | Wt 218.0 lb

## 2018-08-01 DIAGNOSIS — J0100 Acute maxillary sinusitis, unspecified: Secondary | ICD-10-CM | POA: Diagnosis not present

## 2018-08-01 DIAGNOSIS — M26621 Arthralgia of right temporomandibular joint: Secondary | ICD-10-CM | POA: Diagnosis not present

## 2018-08-01 DIAGNOSIS — Z23 Encounter for immunization: Secondary | ICD-10-CM | POA: Diagnosis not present

## 2018-08-01 MED ORDER — AZITHROMYCIN 250 MG PO TABS: 250.0000 mg | ORAL_TABLET | Freq: Every day | ORAL | 0 refills | Status: DC

## 2018-08-01 MED ORDER — PREDNISONE 10 MG (21) PO TBPK: ORAL_TABLET | ORAL | 0 refills | Status: DC

## 2018-08-01 MED ORDER — FLUTICASONE PROPIONATE 50 MCG/ACT NA SUSP: 2.0000 | Freq: Every day | NASAL | 0 refills | Status: DC

## 2018-08-01 MED ORDER — CYCLOBENZAPRINE HCL 5 MG PO TABS
5.0000 mg | ORAL_TABLET | Freq: Every day | ORAL | 0 refills | Status: DC
Start: 2018-08-01 — End: 2018-09-06

## 2018-08-01 MED ORDER — GUAIFENESIN ER 600 MG PO TB12: 600.0000 mg | ORAL_TABLET | Freq: Two times a day (BID) | ORAL | 0 refills | Status: DC | PRN

## 2018-08-01 NOTE — Progress Notes (Signed)
Subjective:  Patient ID: Matthew Glover, male    DOB: Nov 13, 1985  Age: 32 y.o. MRN: [id number]  CC: Pain (right ear pain going down toward the jaw line. this going on for 2 wks. had sinus infection 2 wks ago. flu shot?)  Sinusitis  This is a new problem. The current episode started 1 to 4 weeks ago. The problem is unchanged. There has been no fever. Associated symptoms include congestion, coughing, ear pain and sinus pressure. Pertinent negatives include no headaches, neck pain or sore throat. Past treatments include oral decongestants. The treatment provided mild relief.  Otalgia   There is pain in the right ear. This is a new problem. The current episode started in the past 7 days. The problem has been waxing and waning. There has been no fever. The pain is moderate. Associated symptoms include coughing and rhinorrhea. Pertinent negatives include no abdominal pain, diarrhea, ear discharge, headaches, hearing loss, neck pain, rash, sore throat or vomiting. He has tried acetaminophen for the symptoms. The treatment provided mild relief. There is no history of a chronic ear infection or hearing loss.   Reviewed past Medical, Social and Family history today.  Outpatient Medications Prior to Visit  Medication Sig Dispense Refill  . HYDROcodone-acetaminophen (NORCO/VICODIN) 5-325 MG tablet Take 1 tablet by mouth every 6 (six) hours as needed for moderate pain. (Patient not taking: Reported on 08/01/2018) 15 tablet 0   No facility-administered medications prior to visit.     ROS See HPI  Objective:  BP 128/84   Pulse (!) 53   Temp 98.2 F (36.8 C) (Oral)   Ht 5\' 11"  (1.803 m)   Wt 218 lb (98.9 kg)   SpO2 98%   BMI 30.40 kg/m   BP Readings from Last 3 Encounters:  08/01/18 128/84  04/29/18 (!) 110/58  04/28/18 (!) 141/86    Wt Readings from Last 3 Encounters:  08/01/18 218 lb (98.9 kg)  04/29/18 210 lb 4 oz (95.4 kg)  04/28/18 212 lb (96.2 kg)    Physical Exam    Constitutional: He is oriented to person, place, and time. He appears well-developed and well-nourished.  HENT:  Right Ear: Tympanic membrane, external ear and ear canal normal. No mastoid tenderness. No middle ear effusion.  Left Ear: Tympanic membrane, external ear and ear canal normal. No mastoid tenderness.  No middle ear effusion.  Nose: Mucosal edema and rhinorrhea present. Right sinus exhibits frontal sinus tenderness. Right sinus exhibits no maxillary sinus tenderness. Left sinus exhibits frontal sinus tenderness. Left sinus exhibits no maxillary sinus tenderness.  Mouth/Throat: Uvula is midline. No trismus in the jaw. No dental abscesses or dental caries. Posterior oropharyngeal erythema present. No oropharyngeal exudate or posterior oropharyngeal edema.  Tenderness over right TMJ joint  Eyes: Pupils are equal, round, and reactive to light. Conjunctivae and EOM are normal.  Neck: Normal range of motion. Neck supple. No thyromegaly present.  Cardiovascular: Normal rate.  Pulmonary/Chest: Effort normal.  Lymphadenopathy:    He has no cervical adenopathy.  Neurological: He is alert and oriented to person, place, and time.  Skin: No rash noted. No erythema.  Vitals reviewed.   Lab Results  Component Value Date   WBC 7.7 04/28/2018   HGB 14.8 04/28/2018   HCT 43.0 04/28/2018   PLT 222.0 04/28/2018   GLUCOSE 99 04/29/2018   CHOL 198 09/28/2010   TRIG 93.0 09/28/2010   HDL 37.70 (L) 09/28/2010   LDLCALC 142 (H) 09/28/2010   ALT 24 04/29/2018  AST 15 04/29/2018   NA 135 04/29/2018   K 4.0 04/29/2018   CL 98 04/29/2018   CREATININE 0.99 04/29/2018   BUN 14 04/29/2018   CO2 30 04/29/2018   TSH 1.98 09/28/2010    Assessment & Plan:   Matthew Glover was seen today for pain.  Diagnoses and all orders for this visit:  Arthralgia of right temporomandibular joint -     predniSONE (STERAPRED UNI-PAK 21 TAB) 10 MG (21) TBPK tablet; As directed on package -     cyclobenzaprine  (FLEXERIL) 5 MG tablet; Take 1 tablet (5 mg total) by mouth at bedtime.  Need for influenza vaccination -     Flu Vaccine QUAD 36+ mos IM  Acute non-recurrent maxillary sinusitis -     azithromycin (ZITHROMAX Z-PAK) 250 MG tablet; Take 1 tablet (250 mg total) by mouth daily. Take 2tabs on first day, then 1tab once a day till complete -     guaiFENesin (MUCINEX) 600 MG 12 hr tablet; Take 1 tablet (600 mg total) by mouth 2 (two) times daily as needed for cough or to loosen phlegm. -     fluticasone (FLONASE) 50 MCG/ACT nasal spray; Place 2 sprays into both nostrils daily.   I have discontinued Matthew Glover's HYDROcodone-acetaminophen. I am also having him start on predniSONE, cyclobenzaprine, azithromycin, guaiFENesin, and fluticasone.  Meds ordered this encounter  Medications  . predniSONE (STERAPRED UNI-PAK 21 TAB) 10 MG (21) TBPK tablet    Sig: As directed on package    Dispense:  21 tablet    Refill:  0    Order Specific Question:   Supervising Provider    Answer:   MATTHEWS, CODY [[id number]]  . cyclobenzaprine (FLEXERIL) 5 MG tablet    Sig: Take 1 tablet (5 mg total) by mouth at bedtime.    Dispense:  7 tablet    Refill:  0    Order Specific Question:   Supervising Provider    Answer:   MATTHEWS, CODY [[id number]]  . azithromycin (ZITHROMAX Z-PAK) 250 MG tablet    Sig: Take 1 tablet (250 mg total) by mouth daily. Take 2tabs on first day, then 1tab once a day till complete    Dispense:  6 tablet    Refill:  0    Order Specific Question:   Supervising Provider    Answer:   MATTHEWS, CODY [[id number]]  . guaiFENesin (MUCINEX) 600 MG 12 hr tablet    Sig: Take 1 tablet (600 mg total) by mouth 2 (two) times daily as needed for cough or to loosen phlegm.    Dispense:  14 tablet    Refill:  0    Order Specific Question:   Supervising Provider    Answer:   MATTHEWS, CODY [[id number]]  . fluticasone (FLONASE) 50 MCG/ACT nasal spray    Sig: Place 2 sprays into both nostrils daily.    Dispense:  16 g      Refill:  0    Order Specific Question:   Supervising Provider    Answer:   MATTHEWS, CODY [[id number]]    Follow-up: Return if symptoms worsen or fail to improve.  Alysia Penna, NP

## 2018-08-01 NOTE — Patient Instructions (Addendum)
Maintain mechanical soft diet. Avoid chewing gum. Use mouth guard at bedtime. Use oral prednisone and flexeril if no improvement in 3days. Start jaw exercise and use cold compress after exercise.  Temporomandibular Joint Syndrome Temporomandibular joint (TMJ) syndrome is a condition that affects the joints between your jaw and your skull. The TMJs are located near your ears and allow your jaw to open and close. These joints and the nearby muscles are involved in all movements of the jaw. People with TMJ syndrome have pain in the area of these joints and muscles. Chewing, biting, or other movements of the jaw can be difficult or painful. TMJ syndrome can be caused by various things. In many cases, the condition is mild and goes away within a few weeks. For some people, the condition can become a long-term problem. What are the causes? Possible causes of TMJ syndrome include:  Grinding your teeth or clenching your jaw. Some people do this when they are under stress.  Arthritis.  Injury to the jaw.  Head or neck injury.  Teeth or dentures that are not aligned well.  In some cases, the cause of TMJ syndrome may not be known. What are the signs or symptoms? The most common symptom is an aching pain on the side of the head in the area of the TMJ. Other symptoms may include:  Pain when moving your jaw, such as when chewing or biting.  Being unable to open your jaw all the way.  Making a clicking sound when you open your mouth.  Headache.  Earache.  Neck or shoulder pain.  How is this diagnosed? Diagnosis can usually be made based on your symptoms, your medical history, and a physical exam. Your health care provider may check the range of motion of your jaw. Imaging tests, such as X-rays or an MRI, are sometimes done. You may need to see your dentist to determine if your teeth and jaw are lined up correctly. How is this treated? TMJ syndrome often goes away on its own. If treatment  is needed, the options may include:  Eating soft foods and applying ice or heat.  Medicines to relieve pain or inflammation.  Medicines to relax the muscles.  A splint, bite plate, or mouthpiece to prevent teeth grinding or jaw clenching.  Relaxation techniques or counseling to help reduce stress.  Transcutaneous electrical nerve stimulation (TENS). This helps to relieve pain by applying an electrical current through the skin.  Acupuncture. This is sometimes helpful to relieve pain.  Jaw surgery. This is rarely needed.  Follow these instructions at home:  Take medicines only as directed by your health care provider.  Eat a soft diet if you are having trouble chewing.  Apply ice to the painful area. ? Put ice in a plastic bag. ? Place a towel between your skin and the bag. ? Leave the ice on for 20 minutes, 2-3 times a day.  Apply a warm compress to the painful area as directed.  Massage your jaw area and perform any jaw stretching exercises as recommended by your health care provider.  If you were given a mouthpiece or bite plate, wear it as directed.  Avoid foods that require a lot of chewing. Do not chew gum.  Keep all follow-up visits as directed by your health care provider. This is important. Contact a health care provider if:  You are having trouble eating.  You have new or worsening symptoms. Get help right away if:  Your jaw locks open  or closed. This information is not intended to replace advice given to you by your health care provider. Make sure you discuss any questions you have with your health care provider. Document Released: 07/10/2001 Document Revised: 06/14/2016 Document Reviewed: 05/20/2014 Elsevier Interactive Patient Education  2018 [employer]   Jaw Range of Motion Exercises Jaw range of motion exercises are exercises that help your jaw to move better. These exercises can help to prevent:  Difficulty opening your mouth.  Pain in your  jaw while it is both open and closed.  What should I be careful of when doing jaw exercises? Make sure that you only do jaw exercises as directed by your health care provider. You should only move your jaw as far as it can go in each direction, if told to do so by your health care provider. Do not move your jaw into positions that cause you any pain. What exercises should I do?  Stick your jaw forward. Hold this position for 1-2 seconds. Allow your jaw to return to its normal position and rest it there for 1-2 seconds. Do this exercise 8 times.  Stand or sit in front of a mirror. Place your tongue on the roof of your mouth, just behind your top teeth. Slowly open and close your jaw, keeping your tongue on the roof of your mouth. While you open and close your mouth, try to keep your jaw from moving toward one side or the other. Repeat this 8 times.  Move your jaw right. Hold this position for 1-2 seconds. Allow your jaw to return to its normal position, and rest it there for 1-2 seconds. Do this exercise 8 times.  Move your jaw left. Hold this position for 1-2 seconds. Allow your jaw to return to its normal position, and rest it there for 1-2 seconds. Do this exercise 8 times.  Open your mouth as far as it is can comfortably go. Hold this position for 1-2 seconds. Then close your mouth and rest for 1-2 seconds. Do this exercise 8 times.  Move your jaw in a circular motion, starting toward the right (clockwise). Repeat this 8 times.  Move your jaw in a circular motion, starting toward the left (counterclockwise). Repeat this 8 times. Apply moist heat packs or ice packs to your jaw before or after performing your exercises as directed by your health care provider. What else can I do? Avoid the following, if they cause jaw pain or they increase your jaw pain:  Chewing gum.  Clenching your jaw or teeth or keeping tension in your jaw muscles.  Leaning on your jaw, such as resting your jaw in  your hand while leaning on a desk.  This information is not intended to replace advice given to you by your health care provider. Make sure you discuss any questions you have with your health care provider. Document Released: 09/27/2008 Document Revised: 03/22/2016 Document Reviewed: 09/15/2014 Elsevier Interactive Patient Education  [employer].

## 2018-09-06 ENCOUNTER — Emergency Department (HOSPITAL_BASED_OUTPATIENT_CLINIC_OR_DEPARTMENT_OTHER): Payer: 59

## 2018-09-06 ENCOUNTER — Emergency Department (HOSPITAL_BASED_OUTPATIENT_CLINIC_OR_DEPARTMENT_OTHER)
Admission: EM | Admit: 2018-09-06 | Discharge: 2018-09-06 | Disposition: A | Discharge status: Home / Self Care | Payer: 59 | Attending: Emergency Medicine | Admitting: Emergency Medicine

## 2018-09-06 ENCOUNTER — Other Ambulatory Visit: Payer: Self-pay

## 2018-09-06 ENCOUNTER — Encounter (HOSPITAL_BASED_OUTPATIENT_CLINIC_OR_DEPARTMENT_OTHER): Payer: Self-pay | Admitting: Adult Health

## 2018-09-06 DIAGNOSIS — Y9289 Other specified places as the place of occurrence of the external cause: Secondary | ICD-10-CM | POA: Insufficient documentation

## 2018-09-06 DIAGNOSIS — Y998 Other external cause status: Secondary | ICD-10-CM | POA: Insufficient documentation

## 2018-09-06 DIAGNOSIS — Y9389 Activity, other specified: Secondary | ICD-10-CM | POA: Insufficient documentation

## 2018-09-06 DIAGNOSIS — S99922A Unspecified injury of left foot, initial encounter: Secondary | ICD-10-CM | POA: Insufficient documentation

## 2018-09-06 DIAGNOSIS — W1789XA Other fall from one level to another, initial encounter: Secondary | ICD-10-CM | POA: Insufficient documentation

## 2018-09-06 DIAGNOSIS — S99912A Unspecified injury of left ankle, initial encounter: Secondary | ICD-10-CM | POA: Diagnosis not present

## 2018-09-06 DIAGNOSIS — Z79899 Other long term (current) drug therapy: Secondary | ICD-10-CM | POA: Insufficient documentation

## 2018-09-06 MED ORDER — CYCLOBENZAPRINE HCL 10 MG PO TABS: 10.0000 mg | ORAL_TABLET | Freq: Two times a day (BID) | ORAL | 0 refills | Status: DC | PRN

## 2018-09-06 MED ORDER — OXYCODONE-ACETAMINOPHEN 5-325 MG PO TABS: 2.0000 | ORAL_TABLET | Freq: Three times a day (TID) | ORAL | 0 refills | Status: DC | PRN

## 2018-09-06 NOTE — ED Triage Notes (Signed)
PResents with left midfoot pain after sliding down the side of a pond. CMS intact. no medications prior to arrival .

## 2018-09-06 NOTE — ED Provider Notes (Signed)
[facility] EMERGENCY DEPARTMENT Provider Note   CSN: [id number] Arrival date & time: 09/06/18  1910     History   Chief Complaint Chief Complaint  Patient presents with  . Foot Pain    HPI Matthew Glover is a 32 y.o. male with a h/o of ADD who presents to the emergency department with a chief complaint of fall that occurred this afternoon.  The patient reports that he was out hunting in the wounds when he attempted [place] on [place] wooden board that served as a bridge when the board snapped. He states that he fell down approximately 8 feet onto a brim and into the lake. He states that he landed with his left foot dorsiflexed.  He denies hitting his head, LOC, nausea, or emesis.  He reports that he was able to get up and was ambulatory. He reports some initial discomfort in his left foot that has been constant and progressively worsened since onset.  He states that now he feels as if he is hobbling around and he can barely put weight on his left foot due to the pain in his foot and ankle.  He denies left foot numbness or weakness, right foot or ankle pain, left knee or hip pain, back pain, or neck pain.  He treated his symptoms by putting a topical muscle relaxer to the areas that are sore with minimal improvement.  The history is provided by the patient. No language interpreter was used.    Past Medical History:  Diagnosis Date  . ADD (attention deficit disorder)    dx clarified 08/2011>start adderall  . ALLERGIC RHINITIS   . ANXIETY   . DEPRESSION    grief rxn after sudden dealth of father 08/2009  . INSOMNIA   . PNEUMONIA    bilateral, req bipap 04/2010 hosp    Patient Active Problem List   Diagnosis Date Noted  . Tonsillitis with exudate 04/29/2018  . Psoriasis 12/25/2011  . ADD (attention deficit disorder)   . DEPRESSION 05/26/2010  . Allergic rhinitis 05/26/2010  . Anxiety state 05/23/2010  . Insomnia 05/23/2010    Past Surgical History:  Procedure  Laterality Date  . APPENDECTOMY  2010        Home Medications    Prior to Admission medications   Medication Sig Start Date End Date Taking? Authorizing Provider  azithromycin (ZITHROMAX Z-PAK) 250 MG tablet Take 1 tablet (250 mg total) by mouth daily. Take 2tabs on first day, then 1tab once a day till complete 08/01/18   Nche, Bonna Gains, NP  cyclobenzaprine (FLEXERIL) 10 MG tablet Take 1 tablet (10 mg total) by mouth 2 (two) times daily as needed for muscle spasms. 09/06/18   Shameria Trimarco A, PA-C  fluticasone (FLONASE) 50 MCG/ACT nasal spray Place 2 sprays into both nostrils daily. 08/01/18   Nche, Bonna Gains, NP  guaiFENesin (MUCINEX) 600 MG 12 hr tablet Take 1 tablet (600 mg total) by mouth 2 (two) times daily as needed for cough or to loosen phlegm. 08/01/18   Nche, Bonna Gains, NP  oxyCODONE-acetaminophen (PERCOCET/ROXICET) 5-325 MG tablet Take 2 tablets by mouth every 8 (eight) hours as needed for severe pain. 09/06/18   Micca Matura A, PA-C  predniSONE (STERAPRED UNI-PAK 21 TAB) 10 MG (21) TBPK tablet As directed on package 08/01/18   Nche, Bonna Gains, NP    Family History Family History  Problem Relation Age of Onset  . Diabetes Other  positive in parent & grandparent not sure which 1  . Hyperlipidemia Other   . Heart disease Other   . Hypertension Other     Social History Social History   Tobacco Use  . Smoking status: Never Smoker  . Smokeless tobacco: Never Used  Substance Use Topics  . Alcohol use: Yes    Comment: 2/week  . Drug use: No     Allergies   Ibuprofen and Promethazine hcl   Review of Systems Review of Systems  Constitutional: Negative for activity change, chills and fever.  Respiratory: Negative for shortness of breath.   Cardiovascular: Negative for chest pain.  Gastrointestinal: Negative for abdominal pain.  Musculoskeletal: Positive for arthralgias, gait problem, joint swelling and myalgias. Negative for back pain, neck pain  and neck stiffness.  Skin: Negative for color change, rash and wound.  Neurological: Negative for dizziness, weakness, numbness and headaches.     Physical Exam Updated Vital Signs BP 132/75 (BP Location: Right Arm)   Pulse 75   Temp 98.9 F (37.2 C) (Oral)   Resp 18   Ht 5\' 11"  (1.803 m)   Wt 95.3 kg   SpO2 97%   BMI 29.29 kg/m   Physical Exam  Constitutional: He appears well-developed.  HENT:  Head: Normocephalic.  Eyes: Conjunctivae are normal.  Neck: Neck supple.  Cardiovascular: Normal rate, regular rhythm, normal heart sounds and intact distal pulses. Exam reveals no gallop and no friction rub.  No murmur heard. Pulmonary/Chest: Effort normal and breath sounds normal. No stridor. No respiratory distress. He has no wheezes. He has no rales. He exhibits no tenderness.  Abdominal: Soft. He exhibits no distension.  Musculoskeletal: He exhibits tenderness. He exhibits no edema or deformity.  Tender to palpation over the anterior aspect of the left ankle.  No tenderness to the medial or lateral malleolus.  There is a quarter sized area of edema noted to the dorsum of the left foot, near the lateral aspect.  No obvious ecchymosis or contusion.  Achilles tendon is intact.  Full active and passive range of motion of the left ankle, but it is painful.  Pain is significantly worse with dorsiflexion and improved with plantarflexion.  No pain with movement of the midfoot.  DP and PT pulses are 2+ and symmetric.  Sensation is intact and equal throughout.  Good capillary refill of all digits of the left foot.  Independently move all digits of the left foot.  Normal exam of the left knee.  Good strength against resistance with dorsiflexion and plantarflexion, mildly decreased with dorsiflexion secondary to pain.  Neurological: He is alert.  Skin: Skin is warm and dry.  Psychiatric: His behavior is normal.  Nursing note and vitals reviewed.  ED Treatments / Results  Labs (all labs ordered  are listed, but only abnormal results are displayed) Labs Reviewed - No data to display  EKG None  Radiology Dg Ankle Complete Left  Result Date: 09/06/2018 CLINICAL DATA:  Left ankle pain after fall. EXAM: LEFT ANKLE COMPLETE - 3+ VIEW COMPARISON:  None. FINDINGS: There is no evidence of fracture, dislocation, or joint effusion. There is no evidence of arthropathy or other focal bone abnormality. Soft tissues are unremarkable. IMPRESSION: Negative. Electronically Signed   By: Lupita Raider, M.D.   On: 09/06/2018 19:53   Dg Foot Complete Left  Result Date: 09/06/2018 CLINICAL DATA:  Left foot pain after fall. EXAM: LEFT FOOT - COMPLETE 3+ VIEW COMPARISON:  Radiographs of May 27, 2013. FINDINGS:  There is no evidence of fracture or dislocation. There is no evidence of arthropathy or other focal bone abnormality. Soft tissues are unremarkable. IMPRESSION: Negative. Electronically Signed   By: Lupita Raider, M.D.   On: 09/06/2018 19:52    Procedures Procedures (including critical care time)  Medications Ordered in ED Medications - No data to display   Initial Impression / Assessment and Plan / ED Course  I have reviewed the triage vital signs and the nursing notes.  Pertinent labs & imaging results that were available during my care of the patient were reviewed by me and considered in my medical decision making (see chart for details).     32 year old male with history of ADD presenting with left foot and ankle pain after a fall from approximately 8 feet that occurred while crossing over a pond on a wooden board. He landed in the pond with his left foot dorsiflexed.  He has been ambulatory since the injury, but is has become progressively more painful.  He is neurovascularly intact.  X-ray of the left foot and ankle are negative for fracture dislocation.  He is able to bear weight on the bilateral lower extremities and has an antalgic gait.  No evidence of compartment syndrome or crush  injury.  No obvious tendon rupture and doubt complete tendon or ligament tear given physical exam.  He has full range of motion of the ankle the patient was provided an Ace wrap and crutches in the ED.  Will discharge home with pain control and recommended follow-up with orthopedics if no improvement in his symptoms with RICE therapy in the next 4 to 5 days. A 39-month prescription history query was performed using the [place] CSRS prior to discharge.  Strict return precautions given.  The patient is hemodynamically stable and in no acute distress.  He is safe for discharge home with outpatient follow-up at this time.   Final Clinical Impressions(s) / ED Diagnoses   Final diagnoses:  Foot injury, left, initial encounter  Injury of left ankle, initial encounter    ED Discharge Orders         Ordered    cyclobenzaprine (FLEXERIL) 10 MG tablet  2 times daily PRN     09/06/18 2144    oxyCODONE-acetaminophen (PERCOCET/ROXICET) 5-325 MG tablet  Every 8 hours PRN     09/06/18 2144           Caeden Foots A, PA-C 09/07/18 1610    Gwyneth Sprout, MD 09/09/18 1644

## 2018-09-06 NOTE — ED Notes (Signed)
Pt understood dc material. NAD noted. Scripts given at dc 

## 2018-09-06 NOTE — Discharge Instructions (Addendum)
Thank you for allowing me to care for you today in the Emergency Department.   To care for your left foot and ankle injury at home, use the crutches as needed until you can put weight without significant difficulty on your left foot.  Use the Ace wrap to provide compression to help with pain.  Apply an ice pack for 15 to 20 minutes as frequently as needed to help with pain and swelling.  Take at thousand milligrams of Tylenol every 8 hours for pain control.  You can also try something topical, like lidocaine ointment, which is available over-the-counter to apply directly to the foot to help with pain.  You can also try taking 1 tablet of Flexeril up to 2 times daily for muscle pain and spasms.  This medication can make you drowsy so do not take it before you have to work or drive.  For severe, uncontrollable pain, you can take 1 tablet of Percocet every 8 hours as needed.  Please note, each tablet of Percocet contains 325 mg of Tylenol.  Do not take more than 4000 mg of Tylenol in a 24-hour period.  Percocet is a narcotic and can be addicting.  Do not work or drive while taking this medication.  Do not drink alcohol or take Flexeril, which can also make you drowsy, while taking this medication.  You can start to put weight on your left foot and walk as your pain allows.  If you do not start to have improvement in your symptoms in the next 5 to 7 days with this regimen, please call and schedule follow-up appointment with Dr. Luiz Blare.  His contact information is listed above.  Return to the emergency department if you develop significantly worsening symptoms, including if your foot or toes turn blue, if you develop new significant weakness or numbness in the foot, if you have another fall or injury, or develop significant swelling and firmness throughout the entire foot and ankle, or other new, concerning symptoms.

## 2019-04-27 NOTE — Progress Notes (Signed)
Subjective:    Patient ID: Matthew Glover, male    DOB: Aug 02, 1986, 33 y.o.   MRN: [id number]  HPI The patient is here for an acute visit.   Poison oak or sumac: [place]  He noticed a rash on his left foot and a slightly on the right foot 2 days ago.  The left foot is much worse than the right foot.  The area got red and he has several blisters on the left foot and just a few blisters on the right foot.  He has minimal itching.  He has tried poison ivy spray and Zanfel scrub.  He has had poison ivy in the past and this does not look like poison ivy so he was wondering if it was poison oak or poison sumac.  He has not had any fevers or chills.  He has no other concerning symptoms.    Medications and allergies reviewed with patient and updated if appropriate.  Patient Active Problem List   Diagnosis Date Noted  . Poison sumac 04/28/2019  . Tonsillitis with exudate 04/29/2018  . Psoriasis 12/25/2011  . ADD (attention deficit disorder)   . DEPRESSION 05/26/2010  . Allergic rhinitis 05/26/2010  . Anxiety state 05/23/2010  . Insomnia 05/23/2010    No current outpatient medications on file prior to visit.   No current facility-administered medications on file prior to visit.     Past Medical History:  Diagnosis Date  . ADD (attention deficit disorder)    dx clarified 08/2011>start adderall  . ALLERGIC RHINITIS   . ANXIETY   . DEPRESSION    grief rxn after sudden dealth of father 08/2009  . INSOMNIA   . PNEUMONIA    bilateral, req bipap 04/2010 hosp    Past Surgical History:  Procedure Laterality Date  . APPENDECTOMY  2010    Social History   Socioeconomic History  . Marital status: Single    Spouse name: Not on file  . Number of children: Not on file  . Years of education: Not on file  . Highest education level: Not on file  Occupational History  . Not on file  Social Needs  . Financial resource strain: Not on  file  . Food insecurity    Worry: Not on file    Inability: Not on file  . Transportation needs    Medical: Not on file    Non-medical: Not on file  Tobacco Use  . Smoking status: Never Smoker  . Smokeless tobacco: Never Used  Substance and Sexual Activity  . Alcohol use: Yes    Comment: 2/week  . Drug use: No  . Sexual activity: Not on file  Lifestyle  . Physical activity    Days per week: Not on file    Minutes per session: Not on file  . Stress: Not on file  Relationships  . Social Herbalist on phone: Not on file    Gets together: Not on file    Attends religious service: Not on file    Active member of club or organization: Not on file    Attends meetings of clubs or organizations: Not on file    Relationship status: Not on file  Other Topics Concern  . Not on file  Social History Narrative   Single, has girlfriend. 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 Diabetes Other        positive in parent & grandparent not sure which 1  . Hyperlipidemia Other   . Heart disease Other   . Hypertension Other     Review of Systems  Constitutional: Negative for chills and fever.  Cardiovascular: Negative for leg swelling.  Skin: Positive for color change and rash. Negative for wound (No open wound or active discharge).  Neurological: Negative for numbness.       Objective:   Vitals:   04/28/19 1407  BP: 134/76  Pulse: 62  Resp: 16  Temp: 98.7 F (37.1 C)  SpO2: 99%   BP Readings from Last 3 Encounters:  04/28/19 134/76  09/06/18 132/75  08/01/18 128/84   Wt Readings from Last 3 Encounters:  04/28/19 212 lb 12.8 oz (96.5 kg)  09/06/18 210 lb (95.3 kg)  08/01/18 218 lb (98.9 kg)   Body mass index is 29.68 kg/m.   Physical Exam Constitutional:      General: He is not in acute distress.    Appearance: Normal appearance. He is not ill-appearing.  Skin:     General: Skin is warm and dry.     Comments: Cluster of a few blisters on right medial foot without surrounding erythema.  No open wound or discharge.  Left foot with several small blisters and a few larger listers.  The larger blisters are approximately the size of an almond.  There is some scabbing from previous blisters.  Mild swelling and surrounding erythema.  No wounds or discharge.  Neurological:     Mental Status: He is alert.            Assessment & Plan:    See Problem List for Assessment and Plan of chronic medical problems.

## 2019-04-28 ENCOUNTER — Ambulatory Visit (INDEPENDENT_AMBULATORY_CARE_PROVIDER_SITE_OTHER): Payer: BC Managed Care – PPO | Admitting: Internal Medicine

## 2019-04-28 ENCOUNTER — Other Ambulatory Visit: Payer: Self-pay

## 2019-04-28 ENCOUNTER — Encounter: Payer: Self-pay | Admitting: Internal Medicine

## 2019-04-28 VITALS — BP 134/76 | HR 62 | Temp 98.7°F | Resp 16 | Ht 71.0 in | Wt 212.8 lb

## 2019-04-28 DIAGNOSIS — L237 Allergic contact dermatitis due to plants, except food: Secondary | ICD-10-CM | POA: Diagnosis not present

## 2019-04-28 MED ORDER — PREDNISONE 10 MG PO TABS: ORAL_TABLET | ORAL | 0 refills | Status: DC

## 2019-04-28 MED ORDER — METHYLPREDNISOLONE ACETATE 80 MG/ML IJ SUSP
80.0000 mg | Freq: Once | INTRAMUSCULAR | Status: AC
  Administered 2019-04-28: 80 mg via INTRAMUSCULAR

## 2019-04-28 NOTE — Assessment & Plan Note (Signed)
Rash suggestive of poison sumac He has numerous blisters on his left foot with some associated erythema and mild swelling Depo-Medrol 80 mg IM x1 Oral prednisone starting tomorrow-take with food in the morning He will monitor the rash and call with any questions or concerns

## 2019-04-28 NOTE — Patient Instructions (Addendum)
You had a steroid injection today.   Start the prednisone (steroid) starting tomorrow morning.   Take with food.     Please call if there is no improvement in your symptoms or if you have any questions.

## 2019-08-10 ENCOUNTER — Ambulatory Visit (INDEPENDENT_AMBULATORY_CARE_PROVIDER_SITE_OTHER): Payer: BC Managed Care – PPO | Admitting: Family

## 2019-08-10 ENCOUNTER — Encounter: Payer: Self-pay | Admitting: Family

## 2019-08-10 DIAGNOSIS — J019 Acute sinusitis, unspecified: Secondary | ICD-10-CM

## 2019-08-10 MED ORDER — AMOXICILLIN-POT CLAVULANATE 875-125 MG PO TABS: 1.0000 | ORAL_TABLET | Freq: Two times a day (BID) | ORAL | 0 refills | Status: DC

## 2019-08-10 MED ORDER — FLUTICASONE PROPIONATE 50 MCG/ACT NA SUSP: 2.0000 | Freq: Every day | NASAL | 6 refills | Status: DC

## 2019-08-10 NOTE — Progress Notes (Signed)
  Matthew Glover is a 33 y.o. male with the following history as recorded in [employer]:  Patient Active Problem List   Diagnosis Date Noted  . Poison sumac 04/28/2019  . Tonsillitis with exudate 04/29/2018  . Psoriasis 12/25/2011  . ADD (attention deficit disorder)   . DEPRESSION 05/26/2010  . Allergic rhinitis 05/26/2010  . Anxiety state 05/23/2010  . Insomnia 05/23/2010    Current Outpatient Medications  Medication Sig Dispense Refill  . amoxicillin-clavulanate (AUGMENTIN) 875-125 MG tablet Take 1 tablet by mouth 2 (two) times daily. 20 tablet 0  . fluticasone (FLONASE) 50 MCG/ACT nasal spray Place 2 sprays into both nostrils daily. 16 g 6   No current facility-administered medications for this visit.     Allergies: Ibuprofen and Promethazine hcl  Past Medical History:  Diagnosis Date  . ADD (attention deficit disorder)    dx clarified 08/2011>start adderall  . ALLERGIC RHINITIS   . ANXIETY   . DEPRESSION    grief rxn after sudden dealth of father 08/2009  . INSOMNIA   . PNEUMONIA    bilateral, req bipap 04/2010 hosp    Past Surgical History:  Procedure Laterality Date  . APPENDECTOMY  2010    Family History  Problem Relation Age of Onset  . Diabetes Other        positive in parent & grandparent not sure which 1  . Hyperlipidemia Other   . Heart disease Other   . Hypertension Other     Social History   Tobacco Use  . Smoking status: Never Smoker  . Smokeless tobacco: Never Used  Substance Use Topics  . Alcohol use: Yes    Comment: 2/week    Subjective:    I connected with Ciro Backer on 08/10/19 at 11:20 AM EDT by a video enabled telemedicine application and verified that I am speaking with the correct person using two identifiers. Patient and I are the only 2 people on the call. Provider is in office and patient is at home.    I discussed the limitations of evaluation and management by telemedicine and the availability of in person appointments. The  patient expressed understanding and agreed to proceed.  Concerned for sinus infection; started with allergy symptoms last week and progressively worsened over the past 3-4 days; has been taking OTC Claritin; + sinus pain/ pressure; + yellow congestion; no fever or chest pain or shortness of breath;    Objective:  There were no vitals filed for this visit.  General: Well developed, well nourished, in no acute distress  Head: Normocephalic and atraumatic  Lungs: Respirations unlabored;  Neurologic: Alert and oriented; speech intact; face symmetrical;   Assessment:  1. Acute sinusitis, recurrence not specified, unspecified location     Plan:  Suspect allergic component; continue Claritin; add Flonase; Rx for Augmentin 875 mg bid x 10 days; increase fluids, rest and follow-up worse, no better.   No follow-ups on file.  No orders of the defined types were placed in this encounter.   Requested Prescriptions   Signed Prescriptions Disp Refills  . fluticasone (FLONASE) 50 MCG/ACT nasal spray 16 g 6    Sig: Place 2 sprays into both nostrils daily.  Marland Kitchen amoxicillin-clavulanate (AUGMENTIN) 875-125 MG tablet 20 tablet 0    Sig: Take 1 tablet by mouth 2 (two) times daily.

## 2020-06-30 ENCOUNTER — Encounter: Payer: Self-pay | Admitting: Family Medicine

## 2020-06-30 ENCOUNTER — Telehealth (INDEPENDENT_AMBULATORY_CARE_PROVIDER_SITE_OTHER): Payer: Self-pay | Admitting: Family Medicine

## 2020-06-30 DIAGNOSIS — R519 Headache, unspecified: Secondary | ICD-10-CM

## 2020-06-30 DIAGNOSIS — R0981 Nasal congestion: Secondary | ICD-10-CM

## 2020-06-30 MED ORDER — AMOXICILLIN-POT CLAVULANATE 875-125 MG PO TABS: 1.0000 | ORAL_TABLET | Freq: Two times a day (BID) | ORAL | 0 refills | Status: DC

## 2020-06-30 NOTE — Patient Instructions (Signed)
-  I sent the medication(s) we discussed to your pharmacy: Meds ordered this encounter  Medications  . amoxicillin-clavulanate (AUGMENTIN) 875-125 MG tablet    Sig: Take 1 tablet by mouth 2 (two) times daily.    Dispense:  20 tablet    Refill:  0    I hope you are feeling better soon! Seek care promptly if your symptoms worsen, new concerns arise or you are not improving with treatment.  

## 2020-06-30 NOTE — Progress Notes (Signed)
Virtual Visit via Video Note  I connected with Matthew Glover  on 06/30/20 at  6:20 PM EDT by a video enabled telemedicine application and verified that I am speaking with the correct person using two identifiers.  Location patient: home, [place] Location provider:work or home office Persons participating in the virtual visit: patient, provider  I discussed the limitations of evaluation and management by telemedicine and the availability of in person appointments. The patient expressed understanding and agreed to proceed.   HPI:  Acute visit for sinus issues: -started 2-3 days ago; but had congestion from allergies for several weeks before this started -symptoms include sinus congestion - thick and discolored, sore throat, PND, cough, sinus and tooth pain -denies fevers, SOB, CP, NVD, loss of taste or smell, malaise -fully vaccinated for COVID19 with Moderna -tested negative for COVID19 last night  -reports gets a sinus infection every year   ROS: See pertinent positives and negatives per HPI.  Past Medical History:  Diagnosis Date  . ADD (attention deficit disorder)    dx clarified 08/2011>start adderall  . ALLERGIC RHINITIS   . ANXIETY   . DEPRESSION    grief rxn after sudden dealth of father 08/2009  . INSOMNIA   . PNEUMONIA    bilateral, req bipap 04/2010 hosp    Past Surgical History:  Procedure Laterality Date  . APPENDECTOMY  2010    Family History  Problem Relation Age of Onset  . Diabetes Other        positive in parent & grandparent not sure which 1  . Hyperlipidemia Other   . Heart disease Other   . Hypertension Other     SOCIAL HX: see hpi   Current Outpatient Medications:  .  amoxicillin-clavulanate (AUGMENTIN) 875-125 MG tablet, Take 1 tablet by mouth 2 (two) times daily., Disp: 20 tablet, Rfl: 0  EXAM:  VITALS per patient if applicable: denies fever  GENERAL: alert, oriented, appears well and in no acute distress  HEENT: atraumatic, conjunttiva clear, no  obvious abnormalities on inspection of external nose and ears, points to maxillary sinuses as area of discomfort and upper teeth  NECK: normal movements of the head and neck  LUNGS: on inspection no signs of respiratory distress, breathing rate appears normal, no obvious gross SOB, gasping or wheezing  CV: no obvious cyanosis  MS: moves all visible extremities without noticeable abnormality  PSYCH/NEURO: pleasant and cooperative, no obvious depression or anxiety, speech and thought processing grossly intact  ASSESSMENT AND PLAN:  Discussed the following assessment and plan:  Sinus congestion  Facial pain  -we discussed possible serious and likely etiologies, options for evaluation and workup, limitations of telemedicine visit vs in person visit, treatment, treatment risks and precautions. Pt prefers to treat via telemedicine empirically rather then risking or undertaking an in person visit at this moment. Query sinusitis given several  Weeks of sinus congestion, now developing facial pain and thick sinus congestion. Had a negative covid test and is fully vaccinated. Opted to treat with Augmentin 875mg  bid x 10 days and nasal saline. Advised staying home while sick. Work/School slipped offered:declined Advised to seek prompt follow up telemedicine visit or in person care if worsening, new symptoms arise, or if is not improving with treatment. I discussed the assessment and treatment plan with the patient. The patient was provided an opportunity to ask questions and all were answered. The patient agreed with the plan and demonstrated an understanding of the instructions.   The patient was advised to call  back or seek an in-person evaluation if the symptoms worsen or if the condition fails to improve as anticipated.   Lucretia Kern, DO

## 2020-10-12 ENCOUNTER — Ambulatory Visit: Payer: BC Managed Care – PPO | Admitting: Internal Medicine

## 2020-10-12 ENCOUNTER — Encounter: Payer: Self-pay | Admitting: Internal Medicine

## 2020-10-12 ENCOUNTER — Ambulatory Visit (INDEPENDENT_AMBULATORY_CARE_PROVIDER_SITE_OTHER): Payer: BC Managed Care – PPO

## 2020-10-12 ENCOUNTER — Other Ambulatory Visit: Payer: Self-pay

## 2020-10-12 VITALS — BP 128/82 | HR 64 | Temp 98.1°F | Ht 71.0 in | Wt 224.0 lb

## 2020-10-12 DIAGNOSIS — G8929 Other chronic pain: Secondary | ICD-10-CM

## 2020-10-12 DIAGNOSIS — M545 Low back pain, unspecified: Secondary | ICD-10-CM

## 2020-10-12 DIAGNOSIS — R7989 Other specified abnormal findings of blood chemistry: Secondary | ICD-10-CM | POA: Diagnosis not present

## 2020-10-12 DIAGNOSIS — Z23 Encounter for immunization: Secondary | ICD-10-CM | POA: Diagnosis not present

## 2020-10-12 DIAGNOSIS — D72829 Elevated white blood cell count, unspecified: Secondary | ICD-10-CM | POA: Diagnosis not present

## 2020-10-12 LAB — HEPATIC FUNCTION PANEL
ALT: 28 U/L (ref 0–53)
AST: 18 U/L (ref 0–37)
Albumin: 4.6 g/dL (ref 3.5–5.2)
Alkaline Phosphatase: 56 U/L (ref 39–117)
Bilirubin, Direct: 0.1 mg/dL (ref 0.0–0.3)
Total Bilirubin: 0.9 mg/dL (ref 0.2–1.2)
Total Protein: 7.6 g/dL (ref 6.0–8.3)

## 2020-10-12 LAB — CBC WITH DIFFERENTIAL/PLATELET
Basophils Absolute: 0 10*3/uL (ref 0.0–0.1)
Basophils Relative: 0.5 % (ref 0.0–3.0)
Eosinophils Absolute: 0 10*3/uL (ref 0.0–0.7)
Eosinophils Relative: 0.9 % (ref 0.0–5.0)
HCT: 43.9 % (ref 39.0–52.0)
Hemoglobin: 14.9 g/dL (ref 13.0–17.0)
Lymphocytes Relative: 33.5 % (ref 12.0–46.0)
Lymphs Abs: 1.8 10*3/uL (ref 0.7–4.0)
MCHC: 33.9 g/dL (ref 30.0–36.0)
MCV: 89.3 fl (ref 78.0–100.0)
Monocytes Absolute: 0.4 10*3/uL (ref 0.1–1.0)
Monocytes Relative: 7.2 % (ref 3.0–12.0)
Neutro Abs: 3.1 10*3/uL (ref 1.4–7.7)
Neutrophils Relative %: 57.9 % (ref 43.0–77.0)
Platelets: 243 10*3/uL (ref 150.0–400.0)
RBC: 4.92 Mil/uL (ref 4.22–5.81)
RDW: 12.6 % (ref 11.5–15.5)
WBC: 5.3 10*3/uL (ref 4.0–10.5)

## 2020-10-12 LAB — URINALYSIS, ROUTINE W REFLEX MICROSCOPIC
Bilirubin Urine: NEGATIVE
Hgb urine dipstick: NEGATIVE
Ketones, ur: NEGATIVE
Leukocytes,Ua: NEGATIVE
Nitrite: NEGATIVE
RBC / HPF: NONE SEEN (ref 0–?)
Specific Gravity, Urine: 1.025 (ref 1.000–1.030)
Total Protein, Urine: NEGATIVE
Urine Glucose: NEGATIVE
Urobilinogen, UA: 0.2 (ref 0.0–1.0)
WBC, UA: NONE SEEN (ref 0–?)
pH: 6.5 (ref 5.0–8.0)

## 2020-10-12 LAB — SEDIMENTATION RATE: Sed Rate: 7 mm/hr (ref 0–15)

## 2020-10-12 NOTE — Patient Instructions (Signed)

## 2020-10-12 NOTE — Progress Notes (Signed)
Subjective:  Patient ID: Matthew Glover, male    DOB: 1986-04-11  Age: 34 y.o. MRN: [id number]  CC: Back Pain  This visit occurred during the SARS-CoV-2 public health emergency.  Safety protocols were in place, including screening questions prior to the visit, additional usage of staff PPE, and extensive cleaning of exam room while observing appropriate contact time as indicated for disinfecting solutions.   NEW TO ME  HPI ESDRAS DELAIR presents for f/up -   He tells me that he did a physical with a Dr. Williams Che about 4 to 6 months ago.  He says the doctor told him that he had an elevated white cell cell count and elevated liver enzymes.  He does not have a copy of those labs with him today.  He complains of mild, bilateral low back pain that he describes as a pressure sensation around the flanks and low back.  The pain does not radiate.  He denies paresthesias.  History Shivank has a past medical history of ADD (attention deficit disorder), ALLERGIC RHINITIS, ANXIETY, DEPRESSION, INSOMNIA, and PNEUMONIA.   He has a past surgical history that includes Appendectomy (2010).   His family history includes Diabetes in an other family member; Heart disease in an other family member; Hyperlipidemia in an other family member; Hypertension in an other family member.He reports that he has never smoked. He has never used smokeless tobacco. He reports current alcohol use. He reports that he does not use drugs.  Outpatient Medications Prior to Visit  Medication Sig Dispense Refill  . amoxicillin-clavulanate (AUGMENTIN) 875-125 MG tablet Take 1 tablet by mouth 2 (two) times daily. 20 tablet 0   No facility-administered medications prior to visit.    ROS Review of Systems  Constitutional: Negative.  Negative for appetite change, chills, diaphoresis, fatigue and fever.  HENT: Negative.   Eyes: Negative for visual disturbance.  Respiratory: Negative for cough, shortness of breath and wheezing.    Cardiovascular: Negative for chest pain, palpitations and leg swelling.  Gastrointestinal: Negative.  Negative for abdominal pain, constipation, diarrhea and nausea.  Endocrine: Negative.   Genitourinary: Positive for flank pain. Negative for difficulty urinating, dysuria, frequency, hematuria, scrotal swelling, testicular pain and urgency.  Musculoskeletal: Positive for back pain. Negative for arthralgias and myalgias.  Skin: Negative.  Negative for color change and rash.  Allergic/Immunologic: Negative.   Neurological: Positive for headaches. Negative for dizziness, syncope, weakness and numbness.  Hematological: Negative for adenopathy. Does not bruise/bleed easily.  Psychiatric/Behavioral: Negative.     Objective:  BP 128/82   Pulse 64   Temp 98.1 F (36.7 C) (Oral)   Ht 5\' 11"  (1.803 m)   Wt 224 lb (101.6 kg)   SpO2 97%   BMI 31.24 kg/m   Physical Exam Vitals reviewed.  Constitutional:      Appearance: Normal appearance.  HENT:     Nose: Nose normal.     Mouth/Throat:     Mouth: Mucous membranes are moist.  Eyes:     General: No scleral icterus.    Conjunctiva/sclera: Conjunctivae normal.  Cardiovascular:     Rate and Rhythm: Normal rate and regular rhythm.     Heart sounds: No murmur heard.   Pulmonary:     Effort: Pulmonary effort is normal.     Breath sounds: No stridor. No wheezing, rhonchi or rales.  Abdominal:     General: Abdomen is flat. Bowel sounds are normal. There is no distension.     Palpations: Abdomen is  soft. There is no hepatomegaly, splenomegaly or mass.     Tenderness: There is no abdominal tenderness. There is right CVA tenderness. There is no left CVA tenderness.  Musculoskeletal:        General: Normal range of motion.     Cervical back: Normal and neck supple.     Thoracic back: Normal.     Lumbar back: Tenderness and bony tenderness present. No swelling, signs of trauma or spasms. Normal range of motion. Negative right straight leg  raise test and negative left straight leg raise test.       Back:  Lymphadenopathy:     Cervical: No cervical adenopathy.  Skin:    General: Skin is warm and dry.     Findings: No rash.  Neurological:     General: No focal deficit present.     Mental Status: He is alert.  Psychiatric:        Mood and Affect: Mood normal.        Behavior: Behavior normal.      Lab Results  Component Value Date   WBC 5.3 10/12/2020   HGB 14.9 10/12/2020   HCT 43.9 10/12/2020   PLT 243.0 10/12/2020   GLUCOSE 99 04/29/2018   CHOL 198 09/28/2010   TRIG 93.0 09/28/2010   HDL 37.70 (L) 09/28/2010   LDLCALC 142 (H) 09/28/2010   ALT 28 10/12/2020   AST 18 10/12/2020   NA 135 04/29/2018   K 4.0 04/29/2018   CL 98 04/29/2018   CREATININE 0.99 04/29/2018   BUN 14 04/29/2018   CO2 30 04/29/2018   TSH 1.98 09/28/2010   DG Lumbar Spine Complete  Result Date: 10/12/2020 CLINICAL DATA:  Low back pain for 1 month EXAM: LUMBAR SPINE - COMPLETE 4+ VIEW COMPARISON:  None. FINDINGS: Five lumbar type vertebral bodies are well visualized. Vertebral body height is well maintained. No pars defects are noted. No anterolisthesis is seen. Mild disc space narrowing at L4-5 and L5-S1 is seen. No soft tissue abnormality is noted. IMPRESSION: Mild degenerative change without acute abnormality. Electronically Signed   By: Alcide Clever M.D.   On: 10/12/2020 16:49    Assessment & Plan:   Frazier was seen today for back pain.  Diagnoses and all orders for this visit:  Chronic bilateral low back pain without sciatica- His labs have normalized now.  Plain films show mild degenerative changes.  He is getting adequate symptom relief with over-the-counter options. -     Sedimentation rate; Future -     Urinalysis, Routine w reflex microscopic; Future -     DG Lumbar Spine Complete; Future -     Urinalysis, Routine w reflex microscopic -     Sedimentation rate  Elevated LFTs- His LFTs are normal now.  I will screen him  for viral hepatitis. -     Sedimentation rate; Future -     Hepatic function panel; Future -     Hepatitis B surface antibody,quantitative; Future -     Hepatitis B surface antigen; Future -     Hepatitis C antibody; Future -     Hepatitis B core antibody, total; Future -     Hepatitis A antibody, total; Future -     Hepatitis A antibody, total -     Hepatitis B core antibody, total -     Hepatitis C antibody -     Hepatitis B surface antigen -     Hepatitis B surface antibody,quantitative -  Hepatic function panel -     Sedimentation rate  Leukocytosis, unspecified type- His white cell count and the remainder of his CBC are normal now. -     CBC with Differential/Platelet; Future -     Sedimentation rate; Future -     DG Lumbar Spine Complete; Future -     Sedimentation rate -     CBC with Differential/Platelet  Other orders -     Flu Vaccine QUAD 6+ mos PF IM (Fluarix Quad PF)   I have discontinued Caryn Bee R. Wenz's amoxicillin-clavulanate.  No orders of the defined types were placed in this encounter.    Follow-up: Return in about 4 weeks (around 11/09/2020).  Sanda Linger, MD

## 2020-10-13 LAB — HEPATITIS C ANTIBODY
Hepatitis C Ab: NONREACTIVE
SIGNAL TO CUT-OFF: 0.01 (ref <1.00)

## 2020-10-13 LAB — HEPATITIS B SURFACE ANTIBODY, QUANTITATIVE: Hepatitis B-Post: 291 m[IU]/mL (ref >=10)

## 2020-10-13 LAB — HEPATITIS A ANTIBODY, TOTAL: Hepatitis A AB,Total: NONREACTIVE

## 2020-10-13 LAB — HEPATITIS B CORE ANTIBODY, TOTAL: Hep B Core Total Ab: NONREACTIVE

## 2020-10-13 LAB — HEPATITIS B SURFACE ANTIGEN: Hepatitis B Surface Ag: NONREACTIVE

## 2020-10-24 DIAGNOSIS — S61317A Laceration without foreign body of left little finger with damage to nail, initial encounter: Secondary | ICD-10-CM | POA: Diagnosis not present

## 2020-10-28 ENCOUNTER — Telehealth: Payer: BC Managed Care – PPO | Admitting: Family

## 2020-10-28 DIAGNOSIS — R6889 Other general symptoms and signs: Secondary | ICD-10-CM | POA: Diagnosis not present

## 2020-10-28 MED ORDER — OSELTAMIVIR PHOSPHATE 75 MG PO CAPS: 75.0000 mg | ORAL_CAPSULE | Freq: Two times a day (BID) | ORAL | 0 refills | Status: DC

## 2020-10-28 NOTE — Progress Notes (Signed)
E visit for Flu like symptoms   We are sorry that you are not feeling well.  Here is how we plan to help! Based on what you have shared with me it looks like you may have a respiratory virus that may be influenza.  Influenza or "the flu" is   an infection caused by a respiratory virus. The flu virus is highly contagious and persons who did not receive their yearly flu vaccination may "catch" the flu from close contact.  We have anti-viral medications to treat the viruses that cause this infection. They are not a "cure" and only shorten the course of the infection. These prescriptions are most effective when they are given within the first 2 days of "flu" symptoms. Antiviral medication are indicated if you have a high risk of complications from the flu. You should  also consider an antiviral medication if you are in close contact with someone who is at risk. These medications can help patients avoid complications from the flu  but have side effects that you should know. Possible side effects from Tamiflu or oseltamivir include nausea, vomiting, diarrhea, dizziness, headaches, eye redness, sleep problems or other respiratory symptoms. You should not take Tamiflu if you have an allergy to oseltamivir or any to the ingredients in Tamiflu.  Based upon your symptoms and potential risk factors I have prescribed Oseltamivir (Tamiflu).  It has been sent to your designated pharmacy.  You will take one 75 mg capsule orally twice a day for the next 5 days.   ANYONE WHO HAS FLU SYMPTOMS SHOULD: . Stay home. The flu is highly contagious and going out or to work exposes others! . Be sure to drink plenty of fluids. Water is fine as well as fruit juices, sodas and electrolyte beverages. You may want to stay away from caffeine or alcohol. If you are nauseated, try taking small sips of liquids. How do you know if you are getting enough fluid? Your urine should be a pale yellow or almost colorless. . Get rest. . Taking  a steamy shower or using a humidifier may help nasal congestion and ease sore throat pain. Using a saline nasal spray works much the same way. . Cough drops, hard candies and sore throat lozenges may ease your cough. . Line up a caregiver. Have someone check on you regularly.   GET HELP RIGHT AWAY IF: . You cannot keep down liquids or your medications. . You become short of breath . Your fell like you are going to pass out or loose consciousness. . Your symptoms persist after you have completed your treatment plan MAKE SURE YOU   Understand these instructions.  Will watch your condition.  Will get help right away if you are not doing well or get worse.  Your e-visit answers were reviewed by a board certified advanced clinical practitioner to complete your personal care plan.  Depending on the condition, your plan could have included both over the counter or prescription medications.  If there is a problem please reply  once you have received a response from your provider.  Your safety is important to [place].  If you have drug allergies check your prescription carefully.    You can use MyChart to ask questions about today's visit, request a non-urgent call back, or ask for a work or school excuse for 24 hours related to this e-Visit. If it has been greater than 24 hours you will need to follow up with your provider, or enter a new  e-Visit to address those concerns.  You will get an e-mail in the next two days asking about your experience.  I hope that your e-visit has been valuable and will speed your recovery. Thank you for using e-visits.   Greater than 5 minutes, yet less than 10 minutes of time have been spent researching, coordinating, and implementing care for this patient today.  Thank you for the details you included in the comment boxes. Those details are very helpful in determining the best course of treatment for you and help [place] to provide the best care.

## 2021-06-13 ENCOUNTER — Telehealth: Payer: BC Managed Care – PPO | Admitting: Physician Assistant

## 2021-06-13 DIAGNOSIS — B9689 Other specified bacterial agents as the cause of diseases classified elsewhere: Secondary | ICD-10-CM

## 2021-06-13 DIAGNOSIS — J019 Acute sinusitis, unspecified: Secondary | ICD-10-CM

## 2021-06-13 MED ORDER — AMOXICILLIN-POT CLAVULANATE 875-125 MG PO TABS: 1.0000 | ORAL_TABLET | Freq: Two times a day (BID) | ORAL | 0 refills | Status: DC

## 2021-06-13 NOTE — Progress Notes (Signed)

## 2021-06-13 NOTE — Progress Notes (Signed)
I have spent 5 minutes in review of e-visit questionnaire, review and updating patient chart, medical decision making and response to patient.   Sianne Tejada Cody Tage Feggins, PA-C    

## 2021-09-12 ENCOUNTER — Telehealth: Payer: BC Managed Care – PPO | Admitting: Physician Assistant

## 2021-09-12 DIAGNOSIS — R6889 Other general symptoms and signs: Secondary | ICD-10-CM | POA: Diagnosis not present

## 2021-09-12 MED ORDER — OSELTAMIVIR PHOSPHATE 75 MG PO CAPS: 75.0000 mg | ORAL_CAPSULE | Freq: Two times a day (BID) | ORAL | 0 refills | Status: DC

## 2021-09-12 NOTE — Progress Notes (Signed)
I have spent 5 minutes in review of e-visit questionnaire, review and updating patient chart, medical decision making and response to patient.   Destry Bezdek Cody Elizardo Chilson, PA-C    

## 2021-09-12 NOTE — Progress Notes (Signed)

## 2021-12-01 ENCOUNTER — Ambulatory Visit: Payer: BC Managed Care – PPO | Admitting: Nurse Practitioner

## 2021-12-01 ENCOUNTER — Other Ambulatory Visit: Payer: Self-pay

## 2021-12-01 ENCOUNTER — Encounter: Payer: Self-pay | Admitting: Nurse Practitioner

## 2021-12-01 VITALS — BP 136/62 | HR 66 | Temp 97.0°F | Ht 71.0 in | Wt 231.4 lb

## 2021-12-01 DIAGNOSIS — L03011 Cellulitis of right finger: Secondary | ICD-10-CM

## 2021-12-01 DIAGNOSIS — Z23 Encounter for immunization: Secondary | ICD-10-CM

## 2021-12-01 MED ORDER — MUPIROCIN 2 % EX OINT: 1.0000 "application " | TOPICAL_OINTMENT | Freq: Two times a day (BID) | CUTANEOUS | 0 refills | Status: AC

## 2021-12-01 MED ORDER — DOXYCYCLINE HYCLATE 100 MG PO TABS: 100.0000 mg | ORAL_TABLET | Freq: Two times a day (BID) | ORAL | 0 refills | Status: AC

## 2021-12-01 MED ORDER — MUPIROCIN 2 % EX OINT: 1.0000 "application " | TOPICAL_OINTMENT | Freq: Two times a day (BID) | CUTANEOUS | 0 refills | Status: DC

## 2021-12-01 MED ORDER — DOXYCYCLINE HYCLATE 100 MG PO TABS: 100.0000 mg | ORAL_TABLET | Freq: Two times a day (BID) | ORAL | 0 refills | Status: DC

## 2021-12-01 NOTE — Patient Instructions (Addendum)
Start doxycycline if no improvement in 3days with topical antibiotics  Moist warm compress 2-3x/day, 10-21mins.  Cellulitis, Adult Cellulitis is a skin infection. The infected area is often warm, red, swollen, and sore. It occurs most often in the arms and lower legs. It is very important to get treated for this condition. What are the causes? This condition is caused by bacteria. The bacteria enter through a break in the skin, such as a cut, burn, insect bite, open sore, or crack. What increases the risk? This condition is more likely to occur in people who: Have a weak body defense system (immune system). Have open cuts, burns, bites, or scrapes on the skin. Are older than 34 years of age. Have a blood sugar problem (diabetes). Have a long-lasting (chronic) liver disease (cirrhosis) or kidney disease. Are very overweight (obese). Have a skin problem, such as: Itchy rash (eczema). Slow movement of blood in the veins (venous stasis). Fluid buildup below the skin (edema). Have been treated with high-energy rays (radiation). Use IV drugs. What are the signs or symptoms? Symptoms of this condition include: Skin that is: Red. Streaking. Spotting. Swollen. Sore or painful when you touch it. Warm. A fever. Chills. Blisters. How is this diagnosed? This condition is diagnosed based on: Medical history. Physical exam. Blood tests. Imaging tests. How is this treated? Treatment for this condition may include: Medicines to treat infections or allergies. Home care, such as: Rest. Placing cold or warm cloths (compresses) on the skin. Hospital care, if the condition is very bad. Follow these instructions at home: Medicines Take over-the-counter and prescription medicines only as told by your doctor. If you were prescribed an antibiotic medicine, take it as told by your doctor. Do not stop taking it even if you start to feel better. General instructions  Drink enough fluid to  keep your pee (urine) pale yellow. Do not touch or rub the infected area. Raise (elevate) the infected area above the level of your heart while you are sitting or lying down. Place cold or warm cloths on the area as told by your doctor. Keep all follow-up visits as told by your doctor. This is important. Contact a doctor if: You have a fever. You do not start to get better after 1-2 days of treatment. Your bone or joint under the infected area starts to hurt after the skin has healed. Your infection comes back. This can happen in the same area or another area. You have a swollen bump in the area. You have new symptoms. You feel ill and have muscle aches and pains. Get help right away if: Your symptoms get worse. You feel very sleepy. You throw up (vomit) or have watery poop (diarrhea) for a long time. You see red streaks coming from the area. Your red area gets larger. Your red area turns dark in color. These symptoms may represent a serious problem that is an emergency. Do not wait to see if the symptoms will go away. Get medical help right away. Call your local emergency services (911 in the [place] Do not drive yourself to the hospital. Summary Cellulitis is a skin infection. The area is often warm, red, swollen, and sore. This condition is treated with medicines, rest, and cold and warm cloths. Take all medicines only as told by your doctor. Tell your doctor if symptoms do not start to get better after 1-2 days of treatment. This information is not intended to replace advice given to you by your health care provider. Make  sure you discuss any questions you have with your health care provider. Document Revised: 03/06/2018 Document Reviewed: 03/06/2018 Elsevier Patient Education  2022 [employer].

## 2021-12-01 NOTE — Progress Notes (Signed)
Subjective:  Patient ID: Matthew Glover, male    DOB: [birth year]  Age: 36 y.o. MRN: [id number]  CC: Acute Visit (Pt c/o right thumb swollen and unable to bend x 2 weeks. Pt sates he thinks he got bite by something and it started to heal. Yesterday the swelling returned, stiffness in joint, white head has formed, and it is hot to the touch. Pt has tried otc cream (neosporin) with no relief. )  Wound Check He was originally treated 10 to 14 days ago. Previous treatment included wound cleansing or irrigation. His temperature was unmeasured prior to arrival. There has been colored discharge from the wound. The redness has worsened. The swelling has worsened. The pain has worsened. There is difficulty moving the extremity or digit due to pain.  Redness and swelling after working on a car. He suspects a possible spider bite?  Reviewed past Medical, Social and Family history today.  Outpatient Medications Prior to Visit  Medication Sig Dispense Refill   oseltamivir (TAMIFLU) 75 MG capsule Take 1 capsule (75 mg total) by mouth 2 (two) times daily. (Patient not taking: Reported on 12/01/2021) 10 capsule 0   No facility-administered medications prior to visit.    ROS See HPI  Objective:  BP 136/62 (BP Location: Right Arm, Patient Position: Sitting, Cuff Size: Large)    Pulse 66    Temp (!) 97 F (36.1 C) (Temporal)    Ht 5\' 11"  (1.803 m)    Wt 231 lb 6.4 oz (105 kg)    SpO2 98%    BMI 32.27 kg/m   Physical Exam Vitals reviewed.  Pulmonary:     Effort: Pulmonary effort is normal.  Musculoskeletal:        General: Swelling present.     Right hand: Swelling and tenderness present. No lacerations or bony tenderness. Normal range of motion. Normal strength. Normal capillary refill. Normal pulse.       Hands:  Skin:    Findings: Erythema present.  Neurological:     Mental Status: He is alert and oriented to person, place, and time.  Psychiatric:        Mood and Affect: Mood normal.         Behavior: Behavior normal.        Thought Content: Thought content normal.    Assessment & Plan:  This visit occurred during the SARS-CoV-2 public health emergency.  Safety protocols were in place, including screening questions prior to the visit, additional usage of staff PPE, and extensive cleaning of exam room while observing appropriate contact time as indicated for disinfecting solutions.   [name] was seen today for acute visit.  Diagnoses and all orders for this visit:  Cellulitis of finger of right hand -     Discontinue: doxycycline (VIBRA-TABS) 100 MG tablet; Take 1 tablet (100 mg total) by mouth 2 (two) times daily for 7 days. -     Discontinue: mupirocin ointment (BACTROBAN) 2 %; Place 1 application into the nose 2 (two) times daily for 7 days. Apply with dressing change -     doxycycline (VIBRA-TABS) 100 MG tablet; Take 1 tablet (100 mg total) by mouth 2 (two) times daily for 7 days. -     mupirocin ointment (BACTROBAN) 2 %; Place 1 application into the nose 2 (two) times daily for 7 days. Apply with dressing change  Need for diphtheria-tetanus-pertussis (Tdap) vaccine -     Tdap vaccine greater than or equal to 7yo IM  Start doxycycline if  no improvement in 3days with topical antibiotics Moist warm compress 2-3x/day, 10-11mins.  Problem List Items Addressed This Visit   None Visit Diagnoses     Cellulitis of finger of right hand    -  Primary   Relevant Medications   doxycycline (VIBRA-TABS) 100 MG tablet   mupirocin ointment (BACTROBAN) 2 %   Need for diphtheria-tetanus-pertussis (Tdap) vaccine       Relevant Orders   Tdap vaccine greater than or equal to 7yo IM (Completed)       Follow-up: Return if symptoms worsen or fail to improve.  Matthew Lacy, NP

## 2022-12-10 ENCOUNTER — Ambulatory Visit: Payer: 59 | Admitting: Nurse Practitioner

## 2022-12-10 ENCOUNTER — Encounter: Payer: Self-pay | Admitting: Nurse Practitioner

## 2022-12-10 VITALS — BP 100/70 | HR 55 | Temp 98.3°F | Resp 16 | Ht 71.0 in | Wt 215.6 lb

## 2022-12-10 DIAGNOSIS — J209 Acute bronchitis, unspecified: Secondary | ICD-10-CM

## 2022-12-10 DIAGNOSIS — J0140 Acute pansinusitis, unspecified: Secondary | ICD-10-CM

## 2022-12-10 MED ORDER — DOXYCYCLINE HYCLATE 100 MG PO TABS: 100.0000 mg | ORAL_TABLET | Freq: Two times a day (BID) | ORAL | 0 refills | Status: AC

## 2022-12-10 MED ORDER — BENZONATATE 200 MG PO CAPS: 200.0000 mg | ORAL_CAPSULE | Freq: Three times a day (TID) | ORAL | 0 refills | Status: DC | PRN

## 2022-12-10 NOTE — Patient Instructions (Signed)
Maintain adequate oral hydration Call office if no improvement in 1week

## 2022-12-10 NOTE — Progress Notes (Signed)
                Established Patient Visit  Patient: Matthew Glover   DOB: Aug 02, 1986   37 y.o. Male  MRN: [id number] Visit Date: 12/10/2022  Subjective:    Chief Complaint  Patient presents with   Acute Visit    Samuel [place]- cough, headache, congestion, body ache chills x 1 week    Cough This is a recurrent problem. The current episode started 1 to 4 weeks ago. The problem has been waxing and waning. The problem occurs constantly. The cough is Non-productive. Associated symptoms include chest pain, chills, ear congestion, ear pain, headaches, myalgias, nasal congestion and postnasal drip. Pertinent negatives include no fever, heartburn, hemoptysis, rash, rhinorrhea, sore throat, shortness of breath, sweats, weight loss or wheezing. Nothing aggravates the symptoms. He has tried OTC cough suppressant for the symptoms. The treatment provided no relief. His past medical history is significant for bronchitis. There is no history of asthma, bronchiectasis, COPD, emphysema, environmental allergies or pneumonia.  No tobacco use or second hand exposure. 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 Use of protective gear at all time per patient.  Reviewed medical, surgical, and social history today  Medications: No outpatient medications prior to visit.   No facility-administered medications prior to visit.   Reviewed past medical and social history.   ROS per HPI above      Objective:  BP 100/70 (BP Location: Left Arm, Patient Position: Sitting, Cuff Size: Large)   Pulse (!) 55   Temp 98.3 F (36.8 C) (Oral)   Resp 16   Ht 5\' 11"  (1.803 m)   Wt 215 lb 9.6 oz (97.8 kg)   SpO2 97%   BMI 30.07 kg/m      Physical Exam Vitals reviewed.  Constitutional:      General: He is not in acute distress. Cardiovascular:     Rate and Rhythm: Normal rate and regular rhythm.     Pulses: Normal pulses.     Heart sounds: Normal heart sounds.  Pulmonary:     Effort: Pulmonary effort is normal.      Breath sounds: Normal breath sounds.  Musculoskeletal:     Right lower leg: No edema.     Left lower leg: No edema.  Neurological:     Mental Status: He is alert and oriented to person, place, and time.     No results found for any visits on 12/10/22.    Assessment & Plan:    Problem List Items Addressed This Visit   None Visit Diagnoses     Acute bronchitis, unspecified organism    -  Primary   Relevant Medications   doxycycline (VIBRA-TABS) 100 MG tablet   benzonatate (TESSALON) 200 MG capsule   Acute non-recurrent pansinusitis       Relevant Medications   doxycycline (VIBRA-TABS) 100 MG tablet   benzonatate (TESSALON) 200 MG capsule      Return if symptoms worsen or fail to improve.     [name], NP

## 2023-10-31 ENCOUNTER — Telehealth: Payer: BC Managed Care – PPO | Admitting: Family Medicine

## 2023-10-31 DIAGNOSIS — J209 Acute bronchitis, unspecified: Secondary | ICD-10-CM

## 2023-10-31 DIAGNOSIS — J069 Acute upper respiratory infection, unspecified: Secondary | ICD-10-CM

## 2023-11-01 MED ORDER — BENZONATATE 200 MG PO CAPS: 200.0000 mg | ORAL_CAPSULE | Freq: Three times a day (TID) | ORAL | 0 refills | Status: AC | PRN

## 2023-11-01 MED ORDER — FLUTICASONE PROPIONATE 50 MCG/ACT NA SUSP: 2.0000 | Freq: Every day | NASAL | 6 refills | Status: DC

## 2023-11-01 NOTE — Progress Notes (Signed)

## 2023-11-05 ENCOUNTER — Encounter: Payer: Self-pay | Admitting: Nurse Practitioner

## 2023-11-05 ENCOUNTER — Ambulatory Visit: Payer: BC Managed Care – PPO | Admitting: Nurse Practitioner

## 2023-11-05 VITALS — BP 118/73 | HR 54 | Temp 98.2°F | Resp 19 | Wt 226.4 lb

## 2023-11-05 DIAGNOSIS — H6991 Unspecified Eustachian tube disorder, right ear: Secondary | ICD-10-CM

## 2023-11-05 DIAGNOSIS — J209 Acute bronchitis, unspecified: Secondary | ICD-10-CM

## 2023-11-05 DIAGNOSIS — J0140 Acute pansinusitis, unspecified: Secondary | ICD-10-CM

## 2023-11-05 MED ORDER — ALBUTEROL SULFATE HFA 108 (90 BASE) MCG/ACT IN AERS: 2.0000 | INHALATION_SPRAY | Freq: Four times a day (QID) | RESPIRATORY_TRACT | 0 refills | Status: DC | PRN

## 2023-11-05 MED ORDER — HYDROCODONE BIT-HOMATROP MBR 5-1.5 MG/5ML PO SOLN: 5.0000 mL | Freq: Three times a day (TID) | ORAL | 0 refills | Status: DC | PRN

## 2023-11-05 MED ORDER — ALBUTEROL SULFATE (2.5 MG/3ML) 0.083% IN NEBU: 2.5000 mg | INHALATION_SOLUTION | Freq: Once | RESPIRATORY_TRACT | Status: DC

## 2023-11-05 MED ORDER — CETIRIZINE HCL 10 MG PO TABS: 10.0000 mg | ORAL_TABLET | Freq: Every day | ORAL | Status: DC

## 2023-11-05 MED ORDER — DOXYCYCLINE HYCLATE 100 MG PO TABS: 100.0000 mg | ORAL_TABLET | Freq: Two times a day (BID) | ORAL | 0 refills | Status: AC

## 2023-11-05 NOTE — Progress Notes (Signed)
 Acute Office Visit  Subjective:    Patient ID: Matthew Glover, male    DOB: Jan 25, 1986, 38 y.o.   MRN: [id number]  Chief Complaint  Patient presents with   Acute Visit    PT C/O of cough with right ear pressure for 2 weeks. Patient used antibiotics that was prescribed on 10/31/2023 visit but isn't helping much; cough has become worse.    Accompanied by wife.  Cough This is a new problem. The current episode started 1 to 4 weeks ago. The problem has been gradually worsening. The problem occurs constantly. The cough is Productive of sputum. Associated symptoms include chest pain, ear pain, myalgias, nasal congestion, postnasal drip, rhinorrhea, shortness of breath and wheezing. Pertinent negatives include no chills, ear congestion, fever, headaches, heartburn, hemoptysis, rash, sore throat, sweats or weight loss. The symptoms are aggravated by lying down and cold air. He has tried OTC cough suppressant and prescription cough suppressant (flonase ) for the symptoms. The treatment provided no relief. His past medical history is significant for bronchitis. There is no history of asthma, bronchiectasis, COPD, emphysema, environmental allergies or pneumonia.  No tobacco use or vaping  Outpatient Medications Prior to Visit  Medication Sig   benzonatate  (TESSALON ) 200 MG capsule Take 1 capsule (200 mg total) by mouth 3 (three) times daily as needed for up to 10 days.   fluticasone  (FLONASE ) 50 MCG/ACT nasal spray Place 2 sprays into both nostrils daily.   No facility-administered medications prior to visit.   Reviewed past medical and social history.  Review of Systems  Constitutional:  Negative for chills, fever and weight loss.  HENT:  Positive for ear pain, postnasal drip and rhinorrhea. Negative for sore throat.   Respiratory:  Positive for cough, shortness of breath and wheezing. Negative for hemoptysis.   Cardiovascular:  Positive for chest pain.  Gastrointestinal:  Negative for  heartburn.  Musculoskeletal:  Positive for myalgias.  Skin:  Negative for rash.  Allergic/Immunologic: Negative for environmental allergies.  Neurological:  Negative for headaches.   Per HPI     Objective:    Physical Exam Vitals and nursing note reviewed.  HENT:     Right Ear: Ear canal and external ear normal. A middle ear effusion is present. Tympanic membrane is not injected, scarred, perforated or erythematous.     Left Ear: Tympanic membrane, ear canal and external ear normal.  No middle ear effusion.     Nose: Congestion and rhinorrhea present.  Cardiovascular:     Rate and Rhythm: Normal rate.     Pulses: Normal pulses.  Pulmonary:     Effort: Pulmonary effort is normal. No respiratory distress.     Breath sounds: No stridor. Wheezing present. No rhonchi or rales.  Neurological:     Mental Status: He is alert and oriented to person, place, and time.    BP 118/73 (BP Location: Right Arm, Patient Position: Sitting, Cuff Size: Large)   Pulse (!) 54   Temp 98.2 F (36.8 C) (Oral)   Resp 19   Wt 226 lb 6.4 oz (102.7 kg)   SpO2 98%   BMI 31.58 kg/m    No results found for any visits on 11/05/23.     Assessment & Plan:   Problem List Items Addressed This Visit   None Visit Diagnoses       Eustachian tube dysfunction, right    -  Primary   Relevant Medications   cetirizine  (ZYRTEC ) 10 MG tablet     Acute  bronchitis, unspecified organism       Relevant Medications   albuterol  (PROVENTIL ) (2.5 MG/3ML) 0.083% nebulizer solution 2.5 mg   doxycycline  (VIBRA -TABS) 100 MG tablet   HYDROcodone  bit-homatropine (HYCODAN) 5-1.5 MG/5ML syrup   cetirizine  (ZYRTEC ) 10 MG tablet   albuterol  (VENTOLIN  HFA) 108 (90 Base) MCG/ACT inhaler     Acute non-recurrent pansinusitis       Relevant Medications   doxycycline  (VIBRA -TABS) 100 MG tablet   HYDROcodone  bit-homatropine (HYCODAN) 5-1.5 MG/5ML syrup   cetirizine  (ZYRTEC ) 10 MG tablet      Meds ordered this encounter   Medications   albuterol  (PROVENTIL ) (2.5 MG/3ML) 0.083% nebulizer solution 2.5 mg   doxycycline  (VIBRA -TABS) 100 MG tablet    Sig: Take 1 tablet (100 mg total) by mouth 2 (two) times daily for 7 days.    Dispense:  14 tablet    Refill:  0    Supervising Provider:   KREMER, WILLIAM ALFRED [[id number]]   HYDROcodone  bit-homatropine (HYCODAN) 5-1.5 MG/5ML syrup    Sig: Take 5 mLs by mouth every 8 (eight) hours as needed for cough.    Dispense:  120 mL    Refill:  0    Supervising Provider:   BERNETA FALLOW ALFRED [[id number]]   cetirizine  (ZYRTEC ) 10 MG tablet    Sig: Take 1 tablet (10 mg total) by mouth daily.    Supervising Provider:   BERNETA FALLOW ALFRED [[id number]]   albuterol  (VENTOLIN  HFA) 108 (90 Base) MCG/ACT inhaler    Sig: Inhale 2 puffs into the lungs every 6 (six) hours as needed.    Dispense:  8 g    Refill:  0    Supervising Provider:   BERNETA FALLOW ALFRED [[id number]]  Maintain adequate oral hydration Continue flonase  Start zyrtec  10mg  daily at bedtime x 1week Ok to alternate mucinex  Dm with hycodan if needed. Call office for prednisone  dose pack if no improvement in 1week.  Return if symptoms worsen or fail to improve.   Roselie Mood, NP

## 2023-11-05 NOTE — Patient Instructions (Addendum)
 Maintain adequate oral hydration Continue flonase Start zyrtec 10mg  daily at bedtime x 1week Ok to alternate mucinex Dm with hycodan if needed. Call office for prednisone dose pack if no improvement in 1week.

## 2023-11-12 ENCOUNTER — Telehealth: Payer: Self-pay

## 2023-11-12 DIAGNOSIS — J209 Acute bronchitis, unspecified: Secondary | ICD-10-CM

## 2023-11-12 MED ORDER — PREDNISONE 10 MG (21) PO TBPK: ORAL_TABLET | ORAL | 0 refills | Status: DC

## 2023-11-12 NOTE — Telephone Encounter (Signed)
 Copied from CRM [id number]. Topic: Clinical - Medical Advice >> Nov 11, 2023 12:30 PM Corin V wrote: Reason for CRM: Patient is set to finish his antibiotic prescribed today that was prescribed by Dr. Katheen 11/05/23. He is still having bad congestion and a cough that are keeping him up at night. Dr. Katheen stated she would call in a steroid if the antibiotic did not clear symptoms up. Patient was unsure what Rx Dr. Roselinda was going to choose to send in.

## 2023-11-12 NOTE — Telephone Encounter (Signed)
 Please advise, see below.

## 2024-06-17 ENCOUNTER — Encounter: Payer: Self-pay | Admitting: Internal Medicine

## 2024-06-17 ENCOUNTER — Ambulatory Visit: Admitting: Internal Medicine

## 2024-06-17 ENCOUNTER — Ambulatory Visit: Payer: Self-pay | Admitting: Internal Medicine

## 2024-06-17 VITALS — BP 116/76 | HR 64 | Temp 98.0°F | Resp 16 | Ht 71.0 in | Wt 228.2 lb

## 2024-06-17 DIAGNOSIS — L989 Disorder of the skin and subcutaneous tissue, unspecified: Secondary | ICD-10-CM

## 2024-06-17 DIAGNOSIS — Z0000 Encounter for general adult medical examination without abnormal findings: Secondary | ICD-10-CM | POA: Diagnosis not present

## 2024-06-17 DIAGNOSIS — L409 Psoriasis, unspecified: Secondary | ICD-10-CM

## 2024-06-17 DIAGNOSIS — Z0001 Encounter for general adult medical examination with abnormal findings: Secondary | ICD-10-CM | POA: Insufficient documentation

## 2024-06-17 LAB — BASIC METABOLIC PANEL WITH GFR
BUN: 15 mg/dL (ref 6–23)
CO2: 29 meq/L (ref 19–32)
Calcium: 8.9 mg/dL (ref 8.4–10.5)
Chloride: 101 meq/L (ref 96–112)
Creatinine, Ser: 0.79 mg/dL (ref 0.40–1.50)
GFR: 114.61 mL/min (ref >60.00)
Glucose, Bld: 92 mg/dL (ref 70–99)
Potassium: 4 meq/L (ref 3.5–5.1)
Sodium: 138 meq/L (ref 135–145)

## 2024-06-17 LAB — LIPID PANEL
Cholesterol: 200 mg/dL (ref 0–200)
HDL: 39.3 mg/dL (ref >39.00)
LDL Cholesterol: 137 mg/dL — ABNORMAL HIGH (ref 0–99)
NonHDL: 160.99
Total CHOL/HDL Ratio: 5
Triglycerides: 122 mg/dL (ref 0.0–149.0)
VLDL: 24.4 mg/dL (ref 0.0–40.0)

## 2024-06-17 LAB — HEPATIC FUNCTION PANEL
ALT: 29 U/L (ref 0–53)
AST: 19 U/L (ref 0–37)
Albumin: 4.3 g/dL (ref 3.5–5.2)
Alkaline Phosphatase: 50 U/L (ref 39–117)
Bilirubin, Direct: 0.2 mg/dL (ref 0.0–0.3)
Total Bilirubin: 1.2 mg/dL (ref 0.2–1.2)
Total Protein: 7.3 g/dL (ref 6.0–8.3)

## 2024-06-17 LAB — CBC WITH DIFFERENTIAL/PLATELET
Basophils Absolute: 0 K/uL (ref 0.0–0.1)
Basophils Relative: 0.6 % (ref 0.0–3.0)
Eosinophils Absolute: 0.1 K/uL (ref 0.0–0.7)
Eosinophils Relative: 1.8 % (ref 0.0–5.0)
HCT: 41.7 % (ref 39.0–52.0)
Hemoglobin: 14.1 g/dL (ref 13.0–17.0)
Lymphocytes Relative: 34.5 % (ref 12.0–46.0)
Lymphs Abs: 1.6 K/uL (ref 0.7–4.0)
MCHC: 33.9 g/dL (ref 30.0–36.0)
MCV: 90.1 fl (ref 78.0–100.0)
Monocytes Absolute: 0.4 K/uL (ref 0.1–1.0)
Monocytes Relative: 8.1 % (ref 3.0–12.0)
Neutro Abs: 2.5 K/uL (ref 1.4–7.7)
Neutrophils Relative %: 55 % (ref 43.0–77.0)
Platelets: 254 K/uL (ref 150.0–400.0)
RBC: 4.62 Mil/uL (ref 4.22–5.81)
RDW: 12.4 % (ref 11.5–15.5)
WBC: 4.5 K/uL (ref 4.0–10.5)

## 2024-06-17 NOTE — Progress Notes (Signed)
 Subjective:  Patient ID: Matthew Glover, male    DOB: 09-Nov-1985  Age: 38 y.o. MRN: [id number]  CC: Spot (Spot on his right shoulder first it was a red raised spot and then it turned a blue/gray color. Then it raised up and kinda looked like a wart. He think it got caught onto something because it started bleeding and fell off.) and Annual Exam   HPI LINN GOETZE presents for a CPX ----  Discussed the use of AI scribe software for clinical note transcription with the patient, who gave verbal consent to proceed.  History of Present Illness Matthew Glover is a 38 year old male who presents with a skin lesion on his shoulder.  He has a skin lesion on his shoulder that appeared approximately two to three months ago. Initially red, it turned blue and eventually became raised with a scaly appearance, resembling a wart. He describes it as looking 'funny' and notes color changes over time. Recently, after sun exposure at the beach, the lesion fell off, leaving a small scabbed area. He mentions having had warts on his hands in the past.  He has a history of psoriasis, primarily around his nose, which bleeds when it dries. He finds that sun exposure helps alleviate symptoms. He is not using any consistent treatment for psoriasis, occasionally applying ointment.  He denies having any other lesions or rashes elsewhere on his body, aside from the psoriasis. No testicular pain or swelling is reported.  He is not currently using an albuterol  inhaler and only uses it when he is sick. He denies having asthma.  He is a Company secretary.    Outpatient Medications Prior to Visit  Medication Sig Dispense Refill   albuterol  (VENTOLIN  HFA) 108 (90 Base) MCG/ACT inhaler Inhale 2 puffs into the lungs every 6 (six) hours as needed. 8 g 0   cetirizine  (ZYRTEC ) 10 MG tablet Take 1 tablet (10 mg total) by mouth daily.     fluticasone  (FLONASE ) 50 MCG/ACT nasal spray Place 2 sprays into both nostrils daily. 16 g 6    HYDROcodone  bit-homatropine (HYCODAN) 5-1.5 MG/5ML syrup Take 5 mLs by mouth every 8 (eight) hours as needed for cough. 120 mL 0   predniSONE  (STERAPRED UNI-PAK 21 TAB) 10 MG (21) TBPK tablet As directed on package 21 tablet 0   albuterol  (PROVENTIL ) (2.5 MG/3ML) 0.083% nebulizer solution 2.5 mg      No facility-administered medications prior to visit.    ROS Review of Systems  Skin:  Positive for wound.    Objective:  BP 116/76 (BP Location: Left Arm, Patient Position: Sitting, Cuff Size: Normal)   Pulse 64   Temp 98 F (36.7 C) (Oral)   Resp 16   Ht 5' 11 (1.803 m)   Wt 228 lb 3.2 oz (103.5 kg)   SpO2 97%   BMI 31.83 kg/m   BP Readings from Last 3 Encounters:  06/17/24 116/76  11/05/23 118/73  12/10/22 100/70    Wt Readings from Last 3 Encounters:  06/17/24 228 lb 3.2 oz (103.5 kg)  11/05/23 226 lb 6.4 oz (102.7 kg)  12/10/22 215 lb 9.6 oz (97.8 kg)    Physical Exam Vitals reviewed.  Constitutional:      Appearance: Normal appearance.  HENT:     Nose: Nose normal.     Mouth/Throat:     Mouth: Mucous membranes are moist.  Eyes:     General: No scleral icterus.    Conjunctiva/sclera: Conjunctivae normal.  Cardiovascular:     Rate and Rhythm: Normal rate and regular rhythm.     Pulses: Normal pulses.     Heart sounds: No murmur heard.    No friction rub. No gallop.  Pulmonary:     Effort: Pulmonary effort is normal.     Breath sounds: No stridor. No wheezing, rhonchi or rales.  Abdominal:     General: Abdomen is flat.     Palpations: There is no mass.     Tenderness: There is no abdominal tenderness. There is no guarding.     Hernia: No hernia is present.  Musculoskeletal:        General: Normal range of motion.     Cervical back: Neck supple.     Right lower leg: No edema.     Left lower leg: No edema.  Lymphadenopathy:     Cervical: No cervical adenopathy.  Skin:    Findings: Lesion and rash present.  Neurological:     General: No focal  deficit present.     Mental Status: He is alert. Mental status is at baseline.  Psychiatric:        Mood and Affect: Mood normal.        Behavior: Behavior normal.     Lab Results  Component Value Date   WBC 4.5 06/17/2024   HGB 14.1 06/17/2024   HCT 41.7 06/17/2024   PLT 254.0 06/17/2024   GLUCOSE 92 06/17/2024   CHOL 200 06/17/2024   TRIG 122.0 06/17/2024   HDL 39.30 06/17/2024   LDLCALC 137 (H) 06/17/2024   ALT 29 06/17/2024   AST 19 06/17/2024   NA 138 06/17/2024   K 4.0 06/17/2024   CL 101 06/17/2024   CREATININE 0.79 06/17/2024   BUN 15 06/17/2024   CO2 29 06/17/2024   TSH 1.98 09/28/2010       DG Ankle Complete Left Result Date: 09/06/2018 CLINICAL DATA:  Left ankle pain after fall. EXAM: LEFT ANKLE COMPLETE - 3+ VIEW COMPARISON:  None. FINDINGS: There is no evidence of fracture, dislocation, or joint effusion. There is no evidence of arthropathy or other focal bone abnormality. Soft tissues are unremarkable. IMPRESSION: Negative. Electronically Signed   By: Lynwood Landy Raddle, M.D.   On: 09/06/2018 19:53   DG Foot Complete Left Result Date: 09/06/2018 CLINICAL DATA:  Left foot pain after fall. EXAM: LEFT FOOT - COMPLETE 3+ VIEW COMPARISON:  Radiographs of May 27, 2013. FINDINGS: There is no evidence of fracture or dislocation. There is no evidence of arthropathy or other focal bone abnormality. Soft tissues are unremarkable. IMPRESSION: Negative. Electronically Signed   By: Lynwood Landy Raddle, M.D.   On: 09/06/2018 19:52    Assessment & Plan:  Psoriasis -     Basic metabolic panel with GFR; Future -     CBC with Differential/Platelet; Future -     Hepatic function panel; Future -     Ambulatory referral to Dermatology  Encounter for general adult medical examination with abnormal findings -     Lipid panel; Future     Follow-up: Return in about 1 year (around 06/17/2025).  Debby Molt, MD

## 2024-06-17 NOTE — Patient Instructions (Signed)
 Health Maintenance, Male  Adopting a healthy lifestyle and getting preventive care are important in promoting health and wellness. Ask your health care provider about:  The right schedule for you to have regular tests and exams.  Things you can do on your own to prevent diseases and keep yourself healthy.  What should I know about diet, weight, and exercise?  Eat a healthy diet    Eat a diet that includes plenty of vegetables, fruits, low-fat dairy products, and lean protein.  Do not eat a lot of foods that are high in solid fats, added sugars, or sodium.  Maintain a healthy weight  Body mass index (BMI) is a measurement that can be used to identify possible weight problems. It estimates body fat based on height and weight. Your health care provider can help determine your BMI and help you achieve or maintain a healthy weight.  Get regular exercise  Get regular exercise. This is one of the most important things you can do for your health. Most adults should:  Exercise for at least 150 minutes each week. The exercise should increase your heart rate and make you sweat (moderate-intensity exercise).  Do strengthening exercises at least twice a week. This is in addition to the moderate-intensity exercise.  Spend less time sitting. Even light physical activity can be beneficial.  Watch cholesterol and blood lipids  Have your blood tested for lipids and cholesterol at 36 years of age, then have this test every 5 years.  You may need to have your cholesterol levels checked more often if:  Your lipid or cholesterol levels are high.  You are older than 35 years of age.  You are at high risk for heart disease.  What should I know about cancer screening?  Many types of cancers can be detected early and may often be prevented. Depending on your health history and family history, you may need to have cancer screening at various ages. This may include screening for:  Colorectal cancer.  Prostate cancer.  Skin cancer.  Lung  cancer.  What should I know about heart disease, diabetes, and high blood pressure?  Blood pressure and heart disease  High blood pressure causes heart disease and increases the risk of stroke. This is more likely to develop in people who have high blood pressure readings or are overweight.  Talk with your health care provider about your target blood pressure readings.  Have your blood pressure checked:  Every 3-5 years if you are 24-52 years of age.  Every year if you are 3 years old or older.  If you are between the ages of 60 and 72 and are a current or former smoker, ask your health care provider if you should have a one-time screening for abdominal aortic aneurysm (AAA).  Diabetes  Have regular diabetes screenings. This checks your fasting blood sugar level. Have the screening done:  Once every three years after age 66 if you are at a normal weight and have a low risk for diabetes.  More often and at a younger age if you are overweight or have a high risk for diabetes.  What should I know about preventing infection?  Hepatitis B  If you have a higher risk for hepatitis B, you should be screened for this virus. Talk with your health care provider to find out if you are at risk for hepatitis B infection.  Hepatitis C  Blood testing is recommended for:  Everyone born from [birth year] through 1963.  Anyone  with known risk factors for hepatitis C.  Sexually transmitted infections (STIs)  You should be screened each year for STIs, including gonorrhea and chlamydia, if:  You are sexually active and are younger than 36 years of age.  You are older than 36 years of age and your health care provider tells you that you are at risk for this type of infection.  Your sexual activity has changed since you were last screened, and you are at increased risk for chlamydia or gonorrhea. Ask your health care provider if you are at risk.  Ask your health care provider about whether you are at high risk for HIV. Your health care provider  may recommend a prescription medicine to help prevent HIV infection. If you choose to take medicine to prevent HIV, you should first get tested for HIV. You should then be tested every 3 months for as long as you are taking the medicine.  Follow these instructions at home:  Alcohol use  Do not drink alcohol if your health care provider tells you not to drink.  If you drink alcohol:  Limit how much you have to 0-2 drinks a day.  Know how much alcohol is in your drink. In the [place] one drink equals one 12 oz bottle of beer (355 mL), one 5 oz glass of wine (148 mL), or one 1 oz glass of hard liquor (44 mL).  Lifestyle  Do not use any products that contain nicotine or tobacco. These products include cigarettes, chewing tobacco, and vaping devices, such as e-cigarettes. If you need help quitting, ask your health care provider.  Do not use street drugs.  Do not share needles.  Ask your health care provider for help if you need support or information about quitting drugs.  General instructions  Schedule regular health, dental, and eye exams.  Stay current with your vaccines.  Tell your health care provider if:  You often feel depressed.  You have ever been abused or do not feel safe at home.  Summary  Adopting a healthy lifestyle and getting preventive care are important in promoting health and wellness.  Follow your health care provider's instructions about healthy diet, exercising, and getting tested or screened for diseases.  Follow your health care provider's instructions on monitoring your cholesterol and blood pressure.  This information is not intended to replace advice given to you by your health care provider. Make sure you discuss any questions you have with your health care provider.  Document Revised: 03/06/2021 Document Reviewed: 03/06/2021  Elsevier Patient Education  2024 [employer].

## 2024-06-18 DIAGNOSIS — L989 Disorder of the skin and subcutaneous tissue, unspecified: Secondary | ICD-10-CM | POA: Insufficient documentation

## 2024-06-23 ENCOUNTER — Encounter: Payer: Self-pay | Admitting: Internal Medicine

## 2024-07-06 ENCOUNTER — Ambulatory Visit: Admitting: Nurse Practitioner

## 2024-07-06 ENCOUNTER — Encounter: Payer: Self-pay | Admitting: Nurse Practitioner

## 2024-07-06 VITALS — BP 116/76 | HR 81 | Temp 98.2°F | Ht 71.0 in | Wt 224.2 lb

## 2024-07-06 DIAGNOSIS — U071 COVID-19: Secondary | ICD-10-CM

## 2024-07-06 LAB — POCT INFLUENZA A/B
Influenza A, POC: NEGATIVE
Influenza B, POC: NEGATIVE

## 2024-07-06 LAB — POC COVID19 BINAXNOW: SARS Coronavirus 2 Ag: POSITIVE — AB

## 2024-07-06 NOTE — Patient Instructions (Signed)
 Encourage adequate oral hydration. Use over-the-counter  cold medicine  such as Dayquil/Nyquil/Theraflu/Alkaseltzer for cough and sinus congestion. Use mucinex  DM or Robitussin  or delsym for cough without sinus congestion  You can use plain Tylenol  or Advil for fever, chills and achyness. Use cool mist humidifier at bedtime to help with nasal congestion and cough.  Cold/cough medications may have tylenol  or ibuprofen or guaifenesin  or dextromethophan in them, so be careful not to take beyond the recommended dose for each of these medications.  COVID-19: What to Know COVID-19 is an infection caused by a virus called SARS-CoV-2. This type of virus is called a coronavirus. People with COVID-19 may: Have few to no symptoms. Have mild to moderate symptoms that affect their lungs and breathing. Get very sick. What are the causes?  COVID-19 is caused by a virus. This virus may be in the air as droplets or on surfaces. It can spread from an infected person when they cough, sneeze, speak, sing, or breathe. You may become infected if: You breathe in the infected droplets in the air. You touch an object that has the virus on it. What increases the risk? You are at risk of getting COVID-19 if you have been around someone with the infection. You may be more likely to get very sick if: You are 31 years old or older. You have certain medical conditions, such as: Heart disease. Diabetes. Long-term respiratory disease. Cancer. Pregnancy. You are immunocompromised. This means your body can't fight infections easily. You have a disability that makes it hard for you to move around, you have trouble moving, or you can't move at all. What are the signs or symptoms? People may have different symptoms from COVID-19. The symptoms can also be mild to very bad. They often show up in 5-6 days after being infected. But, they can take up to 14 days to appear. Common symptoms are: Cough. Feeling tired. New  loss of taste or smell. Fever. Less common symptoms are: Sore throat. Headache. Body or muscle aches. Diarrhea. A skin rash or fingers or toes that are a different color than usual. Red or irritated eyes. Sometimes, COVID-19 does not cause symptoms. How is this diagnosed? COVID-19 can be diagnosed with tests done in the lab or at home. Fluid from your nose, mouth, or lungs will be used to check for the virus. How is this treated? Treatment for COVID-19 depends on how sick you are. Mild symptoms can be treated at home with rest, fluids, and over-the-counter medicines. very bad symptoms may be treated in a hospital intensive care unit (ICU). If you have symptoms and are at risk of getting very sick, you may be given a medicine that fights viruses. This medicine is called an antiviral. How is this prevented? To protect yourself from COVID-19: Know your risk factors. Get vaccinated. If your body can't fight infections easily, talk to your provider about treatment to help prevent COVID-19. Stay at least about 3 feet (1 meter) away from other people. Wear mask that fits well when: You can't stay at a distance from people. You're in a place with not a lot of air flow. Try to be in open spaces with good air flow when you are in public. Wash your hands often or use an alcohol-based hand sanitizer. Cover your nose and mouth when you cough or sneeze. If you think you have COVID-19 or have been around someone who has it, stay home and away from other people as told by your provider  or health officials. Where to find more information To learn more: Go to [employer] Click Health Topics. Type COVID-19 in the search box. Go to [employer] Click Health Topics. Then click All Topics. Type COVID-19 in the search box. Get help right away if: You have trouble breathing or get short of breath. You have pain or pressure in your chest. You're feeling confused. These symptoms may be an emergency.  Get help right away. Call 911. Do not wait to see if the symptoms will go away. Do not drive yourself to the hospital. This information is not intended to replace advice given to you by your health care provider. Make sure you discuss any questions you have with your health care provider. Document Revised: 07/18/2023 Document Reviewed: 07/10/2023 Elsevier Patient Education  2025 [employer].

## 2024-07-06 NOTE — Progress Notes (Signed)
   Acute Office Visit  Subjective:    Patient ID: Matthew Glover, male    DOB: 1986/06/05, 38 y.o.   MRN: [id number]  Chief Complaint  Patient presents with   Headache    Started Saturday with body aches, chills, soreness, headache, dizzy fever chills nasal congestion took OTC mucinex .   URI  This is a new problem. The current episode started in the past 7 days. The problem has been unchanged. There has been no fever. Associated symptoms include congestion, coughing, headaches, rhinorrhea and sinus pain. Pertinent negatives include no abdominal pain, chest pain, diarrhea, dysuria, ear pain, joint pain, joint swelling, nausea, neck pain, plugged ear sensation, rash, sneezing, sore throat, swollen glands, vomiting or wheezing. He has tried decongestant and acetaminophen  for the symptoms. The treatment provided significant relief.   No outpatient medications prior to visit.   No facility-administered medications prior to visit.   Reviewed past medical and social history.  Review of Systems  HENT:  Positive for congestion, rhinorrhea and sinus pain. Negative for ear pain, sneezing and sore throat.   Respiratory:  Positive for cough. Negative for wheezing.   Cardiovascular:  Negative for chest pain.  Gastrointestinal:  Negative for abdominal pain, diarrhea, nausea and vomiting.  Genitourinary:  Negative for dysuria.  Musculoskeletal:  Negative for joint pain and neck pain.  Skin:  Negative for rash.  Neurological:  Positive for headaches.   Per HPI     Objective:    Physical Exam Vitals and nursing note reviewed.  HENT:     Right Ear: Tympanic membrane, ear canal and external ear normal.     Left Ear: Tympanic membrane, ear canal and external ear normal.  Pulmonary:     Effort: Pulmonary effort is normal.     Breath sounds: Normal breath sounds.  Neurological:     Mental Status: He is alert.     BP 116/76   Pulse 81   Temp 98.2 F (36.8 C) (Temporal)   Ht 5' 11 (1.803  m)   Wt 224 lb 3.2 oz (101.7 kg)   SpO2 98%   BMI 31.27 kg/m    Results for orders placed or performed in visit on 07/06/24  POC COVID-19 BinaxNow  Result Value Ref Range   SARS Coronavirus 2 Ag Positive (A) Negative  POCT Influenza A/B  Result Value Ref Range   Influenza A, POC Negative Negative   Influenza B, POC Negative Negative      Assessment & Plan:   Problem List Items Addressed This Visit   None Visit Diagnoses       COVID-19    -  Primary   Relevant Orders   POC COVID-19 BinaxNow (Completed)   POCT Influenza A/B (Completed)     Symptom management since symptoms are mild and low risk for complications or hospitalization.  Encourage adequate oral hydration. Use over-the-counter  cold medicine  such as Dayquil/Nyquil/Theraflu/Alkaseltzer for cough and sinus congestion. Use mucinex  DM or Robitussin  or delsym for cough without sinus congestion  You can use plain Tylenol  or Advil for fever, chills and achyness. Use cool mist humidifier at bedtime to help with nasal congestion and cough.  No orders of the defined types were placed in this encounter.  Return if symptoms worsen or fail to improve.    Roselie Mood, NP

## 2024-07-08 ENCOUNTER — Telehealth: Payer: Self-pay | Admitting: Internal Medicine

## 2024-07-08 NOTE — Telephone Encounter (Signed)
 Pt was seen by Roselie on 07/06/24 for flu like symptoms.

## 2024-07-08 NOTE — Telephone Encounter (Signed)
 Copied from CRM [id number]. Topic: Clinical - Medication Question >> Jul 08, 2024  4:19 PM Shereese L wrote: Reason for CRM: patient was offered paxlivit at the time of the appt and declined it but now he wants the Doctor to send the script in for paxlivit so that he can feel better. Patient would like a call to be advised if medication has been sent

## 2024-07-09 ENCOUNTER — Other Ambulatory Visit: Payer: Self-pay | Admitting: Internal Medicine

## 2024-07-09 DIAGNOSIS — J208 Acute bronchitis due to other specified organisms: Secondary | ICD-10-CM | POA: Insufficient documentation

## 2024-07-09 MED ORDER — NIRMATRELVIR/RITONAVIR (PAXLOVID)TABLET: 3.0000 | ORAL_TABLET | Freq: Two times a day (BID) | ORAL | 0 refills | Status: AC

## 2024-07-14 DIAGNOSIS — L400 Psoriasis vulgaris: Secondary | ICD-10-CM | POA: Diagnosis not present

## 2024-07-14 DIAGNOSIS — L739 Follicular disorder, unspecified: Secondary | ICD-10-CM | POA: Diagnosis not present

## 2024-07-14 DIAGNOSIS — L821 Other seborrheic keratosis: Secondary | ICD-10-CM | POA: Diagnosis not present

## 2024-07-14 DIAGNOSIS — L814 Other melanin hyperpigmentation: Secondary | ICD-10-CM | POA: Diagnosis not present

## 2025-01-20 ENCOUNTER — Ambulatory Visit: Admitting: Physician Assistant
